# Patient Record
Sex: Female | Born: 1965 | Race: White | Hispanic: No | Marital: Single | Smoking: Never smoker
Health system: Southern US, Community
[De-identification: ages and names within clinical notes are randomized; demographics above are authoritative.]

## PROBLEM LIST (undated history)

## (undated) DIAGNOSIS — IMO0002 Reserved for concepts with insufficient information to code with codable children: Secondary | ICD-10-CM

## (undated) DIAGNOSIS — N393 Stress incontinence (female) (male): Secondary | ICD-10-CM

## (undated) DIAGNOSIS — F988 Other specified behavioral and emotional disorders with onset usually occurring in childhood and adolescence: Secondary | ICD-10-CM

## (undated) DIAGNOSIS — K219 Gastro-esophageal reflux disease without esophagitis: Secondary | ICD-10-CM

## (undated) DIAGNOSIS — J45909 Unspecified asthma, uncomplicated: Secondary | ICD-10-CM

## (undated) DIAGNOSIS — N35919 Unspecified urethral stricture, male, unspecified site: Secondary | ICD-10-CM

## (undated) DIAGNOSIS — R319 Hematuria, unspecified: Secondary | ICD-10-CM

---

## 2000-12-27 ENCOUNTER — Other Ambulatory Visit: Admission: RE | Admit: 2000-12-27 | Discharge: 2000-12-27 | Payer: Self-pay | Admitting: Obstetrics and Gynecology

## 2001-08-01 ENCOUNTER — Inpatient Hospital Stay (HOSPITAL_COMMUNITY): Admission: AD | Admit: 2001-08-01 | Discharge: 2001-08-03 | Payer: Self-pay | Admitting: Obstetrics and Gynecology

## 2001-08-04 ENCOUNTER — Encounter: Admission: RE | Admit: 2001-08-04 | Discharge: 2001-09-03 | Payer: Self-pay | Admitting: Obstetrics and Gynecology

## 2001-08-28 ENCOUNTER — Other Ambulatory Visit: Admission: RE | Admit: 2001-08-28 | Discharge: 2001-08-28 | Payer: Self-pay | Admitting: Obstetrics and Gynecology

## 2002-10-21 ENCOUNTER — Other Ambulatory Visit: Admission: RE | Admit: 2002-10-21 | Discharge: 2002-10-21 | Payer: Self-pay | Admitting: Obstetrics and Gynecology

## 2003-05-05 ENCOUNTER — Inpatient Hospital Stay (HOSPITAL_COMMUNITY): Admission: AD | Admit: 2003-05-05 | Discharge: 2003-05-07 | Payer: Self-pay | Admitting: Obstetrics and Gynecology

## 2003-06-14 ENCOUNTER — Other Ambulatory Visit: Admission: RE | Admit: 2003-06-14 | Discharge: 2003-06-14 | Payer: Self-pay | Admitting: Obstetrics and Gynecology

## 2004-08-21 ENCOUNTER — Other Ambulatory Visit: Admission: RE | Admit: 2004-08-21 | Discharge: 2004-08-21 | Payer: Self-pay | Admitting: Obstetrics and Gynecology

## 2005-02-05 ENCOUNTER — Ambulatory Visit: Payer: Self-pay | Admitting: Family Medicine

## 2010-01-11 ENCOUNTER — Encounter: Admission: RE | Admit: 2010-01-11 | Discharge: 2010-01-11 | Payer: Self-pay | Admitting: Obstetrics and Gynecology

## 2010-01-18 ENCOUNTER — Encounter: Admission: RE | Admit: 2010-01-18 | Discharge: 2010-01-18 | Payer: Self-pay | Admitting: Obstetrics and Gynecology

## 2010-02-03 ENCOUNTER — Encounter: Admission: RE | Admit: 2010-02-03 | Discharge: 2010-02-03 | Payer: Self-pay | Admitting: Obstetrics and Gynecology

## 2013-11-21 HISTORY — PX: INTRAUTERINE DEVICE INSERTION: SHX323

## 2014-03-29 ENCOUNTER — Other Ambulatory Visit: Payer: Self-pay | Admitting: Urology

## 2014-04-08 ENCOUNTER — Encounter (HOSPITAL_BASED_OUTPATIENT_CLINIC_OR_DEPARTMENT_OTHER): Payer: Self-pay | Admitting: *Deleted

## 2014-04-09 ENCOUNTER — Encounter (HOSPITAL_BASED_OUTPATIENT_CLINIC_OR_DEPARTMENT_OTHER): Payer: Self-pay | Admitting: *Deleted

## 2014-04-09 NOTE — Progress Notes (Signed)
NPO AFTER MN. ARRIVE AT 0800. NEEDS HG AND URINE PREG.  WILL TAKE AM MEDS W/ SIPS OF WATER DOS WITH EXCEPTION NO CONCERTA.

## 2014-04-19 ENCOUNTER — Encounter (HOSPITAL_BASED_OUTPATIENT_CLINIC_OR_DEPARTMENT_OTHER): Payer: Self-pay

## 2014-04-19 ENCOUNTER — Encounter (HOSPITAL_BASED_OUTPATIENT_CLINIC_OR_DEPARTMENT_OTHER)
Admission: RE | Disposition: A | Discharge status: Home / Self Care | Payer: Self-pay | Source: Ambulatory Visit | Attending: Urology

## 2014-04-19 ENCOUNTER — Ambulatory Visit (HOSPITAL_BASED_OUTPATIENT_CLINIC_OR_DEPARTMENT_OTHER): Payer: BC Managed Care – PPO | Admitting: Anesthesiology

## 2014-04-19 ENCOUNTER — Ambulatory Visit (HOSPITAL_BASED_OUTPATIENT_CLINIC_OR_DEPARTMENT_OTHER)
Admission: RE | Admit: 2014-04-19 | Discharge: 2014-04-19 | Disposition: A | Discharge status: Home / Self Care | Payer: BC Managed Care – PPO | Source: Ambulatory Visit | Attending: Urology | Admitting: Urology

## 2014-04-19 DIAGNOSIS — J45909 Unspecified asthma, uncomplicated: Secondary | ICD-10-CM | POA: Insufficient documentation

## 2014-04-19 DIAGNOSIS — N186 End stage renal disease: Secondary | ICD-10-CM | POA: Insufficient documentation

## 2014-04-19 DIAGNOSIS — R312 Other microscopic hematuria: Secondary | ICD-10-CM | POA: Diagnosis not present

## 2014-04-19 DIAGNOSIS — R3129 Other microscopic hematuria: Secondary | ICD-10-CM

## 2014-04-19 DIAGNOSIS — N359 Urethral stricture, unspecified: Secondary | ICD-10-CM | POA: Diagnosis not present

## 2014-04-19 HISTORY — DX: Unspecified urethral stricture, male, unspecified site: N35.919

## 2014-04-19 HISTORY — DX: Reserved for concepts with insufficient information to code with codable children: IMO0002

## 2014-04-19 HISTORY — DX: Unspecified asthma, uncomplicated: J45.909

## 2014-04-19 HISTORY — DX: Hematuria, unspecified: R31.9

## 2014-04-19 HISTORY — DX: Stress incontinence (female) (male): N39.3

## 2014-04-19 HISTORY — DX: Gastro-esophageal reflux disease without esophagitis: K21.9

## 2014-04-19 HISTORY — PX: CYSTOSCOPY WITH URETHRAL DILATATION: SHX5125

## 2014-04-19 HISTORY — DX: Other specified behavioral and emotional disorders with onset usually occurring in childhood and adolescence: F98.8

## 2014-04-19 LAB — POCT HEMOGLOBIN-HEMACUE: Hemoglobin: 12.7 g/dL (ref 12.0–15.0)

## 2014-04-19 LAB — POCT PREGNANCY, URINE: Preg Test, Ur: NEGATIVE

## 2014-04-19 SURGERY — CYSTOSCOPY, WITH URETHRAL DILATION
Anesthesia: General | Site: Urethra

## 2014-04-19 MED ORDER — PHENAZOPYRIDINE HCL 200 MG PO TABS: 200.0000 mg | ORAL_TABLET | Freq: Three times a day (TID) | ORAL | Status: DC | PRN

## 2014-04-19 MED ORDER — LACTATED RINGERS IV SOLN
INTRAVENOUS | Status: DC
  Filled 2014-04-19: qty 1000

## 2014-04-19 MED ORDER — ACETAMINOPHEN 10 MG/ML IV SOLN
INTRAVENOUS | Status: DC | PRN
  Administered 2014-04-19: 1000 mg via INTRAVENOUS

## 2014-04-19 MED ORDER — STERILE WATER FOR IRRIGATION IR SOLN
Status: DC | PRN
  Administered 2014-04-19: 3000 mL

## 2014-04-19 MED ORDER — FENTANYL CITRATE 0.05 MG/ML IJ SOLN
25.0000 ug | INTRAMUSCULAR | Status: DC | PRN
  Filled 2014-04-19: qty 1

## 2014-04-19 MED ORDER — PHENAZOPYRIDINE HCL 200 MG PO TABS
200.0000 mg | ORAL_TABLET | Freq: Once | ORAL | Status: AC
  Administered 2014-04-19: 200 mg via ORAL
  Filled 2014-04-19: qty 1

## 2014-04-19 MED ORDER — LACTATED RINGERS IV SOLN
INTRAVENOUS | Status: DC | PRN
  Administered 2014-04-19: 08:00:00 via INTRAVENOUS

## 2014-04-19 MED ORDER — PROMETHAZINE HCL 25 MG/ML IJ SOLN
6.2500 mg | INTRAMUSCULAR | Status: DC | PRN
  Filled 2014-04-19: qty 1

## 2014-04-19 MED ORDER — CIPROFLOXACIN IN D5W 200 MG/100ML IV SOLN
200.0000 mg | INTRAVENOUS | Status: AC
  Administered 2014-04-19: 200 mg via INTRAVENOUS
  Filled 2014-04-19: qty 100

## 2014-04-19 MED ORDER — FENTANYL CITRATE 0.05 MG/ML IJ SOLN
INTRAMUSCULAR | Status: DC | PRN
  Administered 2014-04-19: 25 ug via INTRAVENOUS
  Administered 2014-04-19: 50 ug via INTRAVENOUS
  Administered 2014-04-19: 25 ug via INTRAVENOUS

## 2014-04-19 MED ORDER — DEXAMETHASONE SODIUM PHOSPHATE 10 MG/ML IJ SOLN
INTRAMUSCULAR | Status: DC | PRN
  Administered 2014-04-19: 10 mg via INTRAVENOUS

## 2014-04-19 MED ORDER — LACTATED RINGERS IV SOLN
INTRAVENOUS | Status: DC
  Administered 2014-04-19 (×2): via INTRAVENOUS
  Filled 2014-04-19: qty 1000

## 2014-04-19 MED ORDER — PHENAZOPYRIDINE HCL 100 MG PO TABS
ORAL_TABLET | ORAL | Status: AC
  Filled 2014-04-19: qty 2

## 2014-04-19 MED ORDER — LIDOCAINE HCL (CARDIAC) 20 MG/ML IV SOLN
INTRAVENOUS | Status: DC | PRN
  Administered 2014-04-19: 100 mg via INTRAVENOUS

## 2014-04-19 MED ORDER — PROPOFOL 10 MG/ML IV BOLUS
INTRAVENOUS | Status: DC | PRN
Start: 2014-04-19 — End: 2014-04-19
  Administered 2014-04-19: 200 mg via INTRAVENOUS

## 2014-04-19 MED ORDER — MIDAZOLAM HCL 5 MG/5ML IJ SOLN
INTRAMUSCULAR | Status: DC | PRN
  Administered 2014-04-19 (×2): 1 mg via INTRAVENOUS

## 2014-04-19 MED ORDER — MIDAZOLAM HCL 2 MG/2ML IJ SOLN
INTRAMUSCULAR | Status: AC
  Filled 2014-04-19: qty 2

## 2014-04-19 MED ORDER — CIPROFLOXACIN IN D5W 200 MG/100ML IV SOLN
INTRAVENOUS | Status: AC
  Filled 2014-04-19: qty 100

## 2014-04-19 MED ORDER — MEPERIDINE HCL 25 MG/ML IJ SOLN
6.2500 mg | INTRAMUSCULAR | Status: DC | PRN
  Filled 2014-04-19: qty 1

## 2014-04-19 MED ORDER — FENTANYL CITRATE 0.05 MG/ML IJ SOLN
INTRAMUSCULAR | Status: AC
  Filled 2014-04-19: qty 4

## 2014-04-19 MED ORDER — HYDROCODONE-ACETAMINOPHEN 10-325 MG PO TABS: 1.0000 | ORAL_TABLET | ORAL | Status: DC | PRN

## 2014-04-19 MED ORDER — ONDANSETRON HCL 4 MG/2ML IJ SOLN
INTRAMUSCULAR | Status: DC | PRN
  Administered 2014-04-19: 4 mg via INTRAVENOUS

## 2014-04-19 SURGICAL SUPPLY — 28 items
BAG DRAIN URO-CYSTO SKYTR STRL (DRAIN) ×2 IMPLANT
BAG DRN UROCATH (DRAIN) ×1
BALLN NEPHROSTOMY (BALLOONS)
BALLOON NEPHROSTOMY (BALLOONS) ×1 IMPLANT
CANISTER SUCT LVC 12 LTR MEDI- (MISCELLANEOUS) IMPLANT
CATH FOLEY 2WAY SLVR  5CC 16FR (CATHETERS)
CATH FOLEY 2WAY SLVR 5CC 16FR (CATHETERS) IMPLANT
CLOTH BEACON ORANGE TIMEOUT ST (SAFETY) ×2 IMPLANT
DRAPE CAMERA CLOSED 9X96 (DRAPES) ×2 IMPLANT
ELECT REM PT RETURN 9FT ADLT (ELECTROSURGICAL)
ELECTRODE REM PT RTRN 9FT ADLT (ELECTROSURGICAL) ×1 IMPLANT
GLOVE BIO SURGEON STRL SZ8 (GLOVE) ×2 IMPLANT
GLOVE BIOGEL M 6.5 STRL (GLOVE) ×1 IMPLANT
GLOVE BIOGEL PI IND STRL 6.5 (GLOVE) IMPLANT
GLOVE BIOGEL PI INDICATOR 6.5 (GLOVE) ×2
GOWN STRL REIN XL XLG (GOWN DISPOSABLE) ×1 IMPLANT
GOWN STRL REUS W/TWL LRG LVL3 (GOWN DISPOSABLE) ×1 IMPLANT
GOWN STRL REUS W/TWL XL LVL3 (GOWN DISPOSABLE) ×1 IMPLANT
GOWN XL W/COTTON TOWEL STD (GOWNS) ×1 IMPLANT
IV NS IRRIG 3000ML ARTHROMATIC (IV SOLUTION) IMPLANT
NDL SAFETY ECLIPSE 18X1.5 (NEEDLE) IMPLANT
NEEDLE HYPO 18GX1.5 SHARP (NEEDLE)
NEEDLE HYPO 22GX1.5 SAFETY (NEEDLE) IMPLANT
NS IRRIG 500ML POUR BTL (IV SOLUTION) IMPLANT
PACK CYSTO (CUSTOM PROCEDURE TRAY) ×2 IMPLANT
SYR 20CC LL (SYRINGE) IMPLANT
SYR 30ML LL (SYRINGE) IMPLANT
WATER STERILE IRR 3000ML UROMA (IV SOLUTION) ×3 IMPLANT

## 2014-04-19 NOTE — Anesthesia Preprocedure Evaluation (Signed)
Anesthesia Evaluation  Patient identified by MRN, date of birth, ID band Patient awake    Reviewed: Allergy & Precautions, H&P , NPO status , Patient's Chart, lab work & pertinent test results  Airway Mallampati: II  TM Distance: >3 FB Neck ROM: Full    Dental no notable dental hx.    Pulmonary asthma ,  breath sounds clear to auscultation  Pulmonary exam normal       Cardiovascular negative cardio ROS  Rhythm:Regular Rate:Normal     Neuro/Psych negative neurological ROS  negative psych ROS   GI/Hepatic negative GI ROS, Neg liver ROS,   Endo/Other  negative endocrine ROS  Renal/GU negative Renal ROS  negative genitourinary   Musculoskeletal negative musculoskeletal ROS (+)   Abdominal   Peds negative pediatric ROS (+)  Hematology negative hematology ROS (+)   Anesthesia Other Findings   Reproductive/Obstetrics negative OB ROS                             Anesthesia Physical Anesthesia Plan  ASA: II  Anesthesia Plan: General   Post-op Pain Management:    Induction: Intravenous  Airway Management Planned: LMA  Additional Equipment:   Intra-op Plan:   Post-operative Plan: Extubation in OR  Informed Consent: I have reviewed the patients History and Physical, chart, labs and discussed the procedure including the risks, benefits and alternatives for the proposed anesthesia with the patient or authorized representative who has indicated his/her understanding and acceptance.   Dental advisory given  Plan Discussed with: CRNA  Anesthesia Plan Comments:         Anesthesia Quick Evaluation

## 2014-04-19 NOTE — Anesthesia Procedure Notes (Signed)
Procedure Name: LMA Insertion Date/Time: 04/19/2014 9:40 AM Performed by: Jessica PriestBEESON, Secilia Apps C Pre-anesthesia Checklist: Patient identified, Emergency Drugs available, Suction available and Patient being monitored Patient Re-evaluated:Patient Re-evaluated prior to inductionOxygen Delivery Method: Circle System Utilized Preoxygenation: Pre-oxygenation with 100% oxygen Intubation Type: IV induction Ventilation: Mask ventilation without difficulty LMA: LMA inserted LMA Size: 4.0 Number of attempts: 1 Airway Equipment and Method: bite block Placement Confirmation: positive ETCO2 Tube secured with: Tape Dental Injury: Teeth and Oropharynx as per pre-operative assessment

## 2014-04-19 NOTE — H&P (Signed)
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  History of Present Illness Microscopic hematuria: She reported that she had been told she has always had blood in her urine. Recently she had an episode of what she described as very severe pain in the bladder and this was associated with marked dysuria but her urine culture returned negative. She was treated with empiric antibiotics and her dysuria resolved.     Interval history: No new urologic complaints are noted. She does report that her first child was over 9 pounds and resulted in some infertile trauma. She says she was told in the past that she might have developed some narrowing of the urethra as a cause of recurrent infections that were occurring at that time.   Current Meds 1. Concerta 36 MG Oral Tablet Extended Release;  Therapy: (Recorded:16Nov2015) to Recorded 2. Dexilant 60 MG Oral Capsule Delayed Release;  Therapy: (Recorded:16Nov2015) to Recorded 3. PROzac 20 MG Oral Capsule;  Therapy: (Recorded:16Nov2015) to Recorded 4. Singulair 10 MG Oral Tablet;  Therapy: (Recorded:16Nov2015) to Recorded 5. Vitamin D 2000 UNIT Oral Tablet;  Therapy: (Recorded:16Nov2015) to Recorded 6. ZyrTEC Allergy 10 MG Oral Tablet;  Therapy: (Recorded:16Nov2015) to Recorded  Allergies Medication  1. No Known Drug Allergies Non-Medication  2. Animal dander 3. Dust 4. Mold 5. Ragweed  Family History Problems  1. Family history of diabetes mellitus (Z83.3) : Father 2. Family history of hypertension (Z82.49) : Father  Social History Problems  1. Alcohol use (F10.99) 2. Never a smoker 3. Single  Review of Systems Genitourinary, constitutional, skin, eye, otolaryngeal, hematologic/lymphatic, cardiovascular, pulmonary, endocrine, musculoskeletal, gastrointestinal, neurological and psychiatric system(s) were reviewed and pertinent findings if present are noted.  Genitourinary: urinary frequency, urinary urgency, dysuria,  incontinence, urinary hesitancy, urinary stream starts and stops and hematuria.  Constitutional: feeling tired (fatigue).  Hematologic/Lymphatic: swollen glands.  Respiratory: shortness of breath and cough.    Vitals Vital Signs   Height: 5 ft 8 in Weight: 185 lb  BMI Calculated: 28.13 BSA Calculated: 1.98 Blood Pressure: 124 / 79 Heart Rate: 75  Physical Exam Constitutional: Well nourished and well developed . No acute distress.   ENT:. The ears and nose are normal in appearance.   Neck: The appearance of the neck is normal and no neck mass is present.   Pulmonary: No respiratory distress and normal respiratory rhythm and effort.   Cardiovascular: Heart rate and rhythm are normal . No peripheral edema.   Abdomen: The abdomen is soft and nontender. No masses are palpated. No CVA tenderness. No hernias are palpable. No hepatosplenomegaly noted.   Lymphatics: The femoral and inguinal nodes are not enlarged or tender.   Skin: Normal skin turgor, no visible rash and no visible skin lesions.   Neuro/Psych:. Mood and affect are appropriate.       Results/Data Urine COLOR YELLOW  APPEARANCE CLEAR  SPECIFIC GRAVITY 1.020  pH 7.0  GLUCOSE NEG mg/dL BILIRUBIN NEG  KETONE NEG mg/dL BLOOD MOD  PROTEIN NEG mg/dL UROBILINOGEN 0.2 mg/dL NITRITE NEG  LEUKOCYTE ESTERASE NEG  SQUAMOUS EPITHELIAL/HPF NONE SEEN  WBC NONE SEEN WBC/hpf RBC 3-6 RBC/hpf BACTERIA NONE SEEN  CRYSTALS NONE SEEN  CASTS NONE SEEN   The following images/tracing/specimen were independently visualized:  CT scan as below.   The following radiology reports were reviewed: CT scan. Selected Results  AU CT-HEMATURIA PROTOCOL [id number] 12:00AM Kathie Rhodes  Test Name Result Flag Reference AU CT-HEMATURIA PROTOCOL (Report)   ** RADIOLOGY REPORT BY [facility] **  CLINICAL DATA: 48 year old female with history of microhematuria.  EXAM: CT ABDOMEN AND PELVIS WITHOUT AND WITH  CONTRAST  TECHNIQUE: Multidetector CT imaging of the abdomen and pelvis was performed following the standard protocol before and following the bolus administration of intravenous contrast.  CONTRAST: 125 mL of Isovue 300.  COMPARISON: No priors.  FINDINGS: Lower chest: Mild subsegmental atelectasis or scarring in the lateral aspect of the left lung base.  Hepatobiliary: No cystic for solid hepatic lesions. No intra or extrahepatic biliary ductal dilatation. Gallbladder is normal in appearance.  Pancreas: Unremarkable.  Spleen: Unremarkable.  Adrenals/Urinary Tract: 5 mm nonobstructive calculus in the lower pole collecting system of the right kidney. No additional calculi are identified within the collecting system of the left kidney, along the course of either ureter, or within the lumen of the urinary bladder. No hydroureteronephrosis or perinephric stranding to indicate urinary tract obstruction at this time. Sub cm low-attenuation lesion in the upper pole of the left kidney is too small to definitively characterize, but likely represents a tiny parapelvic cysts. No other suspicious renal lesions are noted. Postcontrast delayed images demonstrate no definite filling defect within the collecting system of either kidney, along the course of either ureter, or within the lumen of the urinary bladder to strongly suggest presence of a urothelial neoplasm. Bilateral adrenal glands are normal in appearance.  Stomach/Bowel: The appearance of the stomach is normal. No pathologic dilatation of small bowel or colon. Normal appendix.  Vascular/Lymphatic: No significant atherosclerotic disease identified within the abdominal or pelvic vasculature. No aneurysm or dissection. Retroaortic left renal vein (normal anatomical variant) incidentally noted. No lymphadenopathy noted in the abdomen or pelvis.  Reproductive: IUD present in the uterus. IUD appears properly located. Uterus and  ovaries are otherwise unremarkable in appearance.  Other: No significant volume of ascites. No pneumoperitoneum.  Musculoskeletal: There are no aggressive appearing lytic or blastic lesions noted in the visualized portions of the skeleton.  IMPRESSION: 1. 5 mm nonobstructive calculus in the lower pole collecting system of the right kidney. No ureteral stones or findings of urinary tract obstruction are noted at this time. 2. No other potential source for microhematuria identified on today's examination. 3. Normal appendix. 4. Additional incidental findings, as above.   Electronically Signed  By: Vinnie Langton M.D.  On: 03/17/2014 13:04  BUN & CREATININE [id number] 04:49PM Kathie Rhodes SPECIMEN TYPE: BLOOD  Test Name Result Flag Reference CREATININE 0.65 mg/dL  0.50-1.40 BUN 11 mg/dL  6-23 Est GFR, African American >89 mL/min   Est GFR, NonAfrican American >89 mL/min   THE ESTIMATED GFR IS A CALCULATION VALID FOR ADULTS (>=38 YEARS OLD) THAT USES THE CKD-EPI ALGORITHM TO ADJUST FOR AGE AND SEX. IT IS   NOT TO BE USED FOR CHILDREN, PREGNANT WOMEN, HOSPITALIZED PATIENTS,    PATIENTS ON DIALYSIS, OR WITH RAPIDLY CHANGING KIDNEY FUNCTION. ACCORDING TO THE NKDEP, EGFR >89 IS NORMAL, 60-89 SHOWS MILD IMPAIRMENT, 30-59 SHOWS MODERATE IMPAIRMENT, 15-29 SHOWS SEVERE IMPAIRMENT AND <15 IS ESRD.  Procedure  Procedure: Cystoscopy performed on 03/22/14 Chaperone Present: .  Indication: Hematuria.  Informed Consent: Risks, benefits, and potential adverse events were discussed and informed consent was obtained from the patient.  Prep: The patient was prepped with hibiclens.  Procedure Note:  Urethral meatus:. No abnormalities.  Anterior urethra: No abnormalities.  Bladder: Visulization was clear. The ureteral orifices were in the normal anatomic position bilaterally and had clear efflux of urine. A systematic survey of the bladder demonstrated no bladder tumors or stones.  The mucosa  was smooth without abnormalities. The patient tolerated the procedure well.  Complications: None.    Assessment   I have discussed with the patient the fact that the CT scan has revealed no abnormality of the upper tract that could contribute to the presence of microscopic hematuria and cystoscopically no abnormality of the lower tract was identified. She did appear to have a stone within the right kidney. It is nonobstructing and we do not know how long it has been present. I told her that it does warrant follow-up with a KUB again in 6 months to assure stability. We also discussed the fact that she has what appears to be a simple cyst in the left kidney.    My attempt at cystoscopy was unsuccessful because she has a significant urethral stenosis/stricture. I was able to dilate her initially from 86 French up to 14 but it was quite uncomfortable and I did not want to push it due to her discomfort. This may be the source of her hematuria but her bladder has not been evaluated and she does have a significant stricture so we discussed proceeding with dilation and cystoscopy under anesthesia. She would like to proceed with that. I gone over the procedure with her today in detail including its risks and complications including the fact that the greatest risk is that of recurrent stricture. We also discussed the probability of success in the outpatient nature of the procedure as well as the anticipated postoperative course. She understands and is elected to proceed.   Plan  She is scheduled for outpatient cystoscopy with urethral dilatation under anesthesia.

## 2014-04-19 NOTE — Discharge Instructions (Signed)

## 2014-04-19 NOTE — Transfer of Care (Signed)
Immediate Anesthesia Transfer of Care Note   Immediate Anesthesia Transfer of Care Note  Patient: Jackie Carr  Procedure(s) Performed: Procedure(s) (LRB): CYSTOSCOPY WITH URETHRAL DILATATION (N/A)  Patient Location: PACU  Anesthesia Type: General  Level of Consciousness: awake, sedated, patient cooperative and responds to stimulation  Airway & Oxygen Therapy: Patient Spontanous Breathing and Patient connected to face mask oxygen  Post-op Assessment: Report given to PACU RN, Post -op Vital signs reviewed and stable and Patient moving all extremities  Post vital signs: Reviewed and stable  Complications: No apparent anesthesia complications

## 2014-04-19 NOTE — Op Note (Signed)
PATIENT:  Jackie Carr  PRE-OPERATIVE DIAGNOSIS:  1. Microscopic hematuria 2. Urethral stenosis/stricture  POST-OPERATIVE DIAGNOSIS: Same  PROCEDURE: 1. Urethral dilatation  2.Diagnostic cystoscopy   SURGEON:  Garnett FarmMark C Cristen Bredeson  INDICATION: Jackie Carr is a 48 year old female who was found to have microscopic hematuria and also had described experiencing a severe pain in the area of the bladder there was associated with dysuria but a negative urine culture. In her acute antibiotics were prescribed by her primary care physician and her dysuria resolved. She has a history of given birth to a 9 pound child which resulted in some introital trauma at the time. She was told in the past that she might of developed some narrowing of the urethra as a cause of your current infections that she had in the past. Workup of her cystoscopy included a CT scan which revealed no abnormality of the upper tract and attempted cystoscopy in my office was uncomfortable. I attempted to dilate her urethra but this was too uncomfortable for her so she is brought to the operating room for examination under anesthesia with urethral dilatation and cystoscopy today.   ANESTHESIA:  General  EBL: None   DRAINS: None  LOCAL MEDICATIONS USED:  None  SPECIMEN:   None   Description of procedure: After informed consent the patient was taken to the operating room and placed on the table in a supine position. General anesthesia was then administered. Once fully anesthetized the patient was moved to the dorsal lithotomy position and the genitalia were sterilely prepped and draped in standard fashion. An official timeout was then performed.   initial evaluation of the vagina revealed a grade 1 cystocele but no lesions. The urethral meatus appeared unremarkable. I was unable to palpate any induration or mass in the area of the urethra.  Urethral dilatation was performed using female sounds starting at 3614 [place] and  progressing to 4322 [place] with little resistance, no bleeding and no difficulty.  The 21 French cystoscope with 12 lens was then passed through the urethra which was noted to be normal in appearance and the bladder was then entered and fully and systematically inspected. Ureteral orifices were of normal configuration and position. There were no tumors, stones or inflammatory lesions seen within the bladder. I therefore drained the bladder and again removed the cystoscope under direct vision which revealed a normal appearing urethra. The patient tolerated the procedure well and there were no intraoperative complications.    PLAN OF CARE: Discharge to home after PACU  PATIENT DISPOSITION:  PACU - hemodynamically stable.

## 2014-04-20 ENCOUNTER — Encounter (HOSPITAL_BASED_OUTPATIENT_CLINIC_OR_DEPARTMENT_OTHER): Payer: Self-pay | Admitting: Urology

## 2014-04-20 NOTE — Anesthesia Postprocedure Evaluation (Signed)
  Anesthesia Post-op Note  Patient: Jackie Carr  Procedure(s) Performed: Procedure(s) (LRB): CYSTOSCOPY WITH URETHRAL DILATATION (N/A)  Patient Location: PACU  Anesthesia Type: General  Level of Consciousness: awake and alert   Airway and Oxygen Therapy: Patient Spontanous Breathing  Post-op Pain: mild  Post-op Assessment: Post-op Vital signs reviewed, Patient's Cardiovascular Status Stable, Respiratory Function Stable, Patent Airway and No signs of Nausea or vomiting  Last Vitals:  Filed Vitals:   04/19/14 1130  BP: 123/79  Pulse: 65  Temp: 36.5 C  Resp: 16    Post-op Vital Signs: stable   Complications: No apparent anesthesia complications

## 2015-01-17 DIAGNOSIS — K219 Gastro-esophageal reflux disease without esophagitis: Secondary | ICD-10-CM | POA: Insufficient documentation

## 2015-01-17 DIAGNOSIS — J309 Allergic rhinitis, unspecified: Secondary | ICD-10-CM | POA: Insufficient documentation

## 2015-01-17 DIAGNOSIS — J45909 Unspecified asthma, uncomplicated: Secondary | ICD-10-CM | POA: Insufficient documentation

## 2015-05-23 ENCOUNTER — Ambulatory Visit (INDEPENDENT_AMBULATORY_CARE_PROVIDER_SITE_OTHER): Payer: BC Managed Care – PPO | Admitting: Allergy and Immunology

## 2015-05-23 ENCOUNTER — Encounter: Payer: Self-pay | Admitting: Allergy and Immunology

## 2015-05-23 VITALS — BP 120/76 | HR 76 | Resp 16 | Ht 67.32 in | Wt 197.8 lb

## 2015-05-23 DIAGNOSIS — H1010 Acute atopic conjunctivitis, unspecified eye: Secondary | ICD-10-CM | POA: Diagnosis not present

## 2015-05-23 DIAGNOSIS — J4520 Mild intermittent asthma, uncomplicated: Secondary | ICD-10-CM

## 2015-05-23 DIAGNOSIS — K219 Gastro-esophageal reflux disease without esophagitis: Secondary | ICD-10-CM

## 2015-05-23 DIAGNOSIS — J387 Other diseases of larynx: Secondary | ICD-10-CM | POA: Diagnosis not present

## 2015-05-23 DIAGNOSIS — J309 Allergic rhinitis, unspecified: Secondary | ICD-10-CM

## 2015-05-23 NOTE — Progress Notes (Signed)
[facility]  Follow-up Note  Referring Provider: No ref. provider found Primary Provider: Dina Rich, MD Date of Office Visit: 05/23/2015  Subjective:   Jackie Carr is a 50 y.o. female who [facility] on 05/23/2015 in re-evaluation of the following:  HPI Comments: Jackie Carr returns to this clinic in reevaluation of her cough. It is been over a year since I've seen her in his clinic.  When I last saw Jackie Carr back in December 2015 we had her use a combination of Dexilant and ranitidine with certainly did give her control of some of her cough but it never completely resolved. Unfortunately, her insurance company took away her Dexilant and then she started to cough like crazy. She subsequently saw Dr. Chales Abrahams who performed an upper endoscopy in January 2017 which identified some swelling of her upper esophageal area which I presume is her posterior position of her larynx. He restarted her on Dexilant and she can tell already that she is better. However, she has never completely gotten rid of her cough. She does continue to have regurgitation about 1 time per day. She does continue to drink coffee in the morning and have a Dr. Reino Kent per day. She does not really have a tremendous amount of shortness of breath or chest tightness but she does note that when she uses a short acting bronchodilator it does help her cough somewhat. She has been intolerant of inhaled steroids in the past because of several precipitating a cough during inhalation. As well, she is try montelukast which has not helped her. She has very little upper airway symptoms at this point in time.    Current Outpatient Prescriptions on File Prior to Visit  Medication Sig Dispense Refill  . albuterol (PROAIR HFA) 108 (90 BASE) MCG/ACT inhaler Inhale 1 puff into the lungs every 6 (six) hours as needed for wheezing or shortness of breath.    .  Cholecalciferol (VITAMIN D3) 1000 UNITS CAPS Take 1 capsule by mouth daily.    Marland Kitchen FLUoxetine (PROZAC) 20 MG capsule Take 20 mg by mouth every morning.    Marland Kitchen HYDROcodone-acetaminophen (NORCO) 10-325 MG per tablet Take 1-2 tablets by mouth every 4 (four) hours as needed for moderate pain. Maximum dose per 24 hours - 8 pills 8 tablet 0  . methylphenidate 36 MG PO CR tablet Take 36 mg by mouth every morning. Reported on 05/23/2015    . ranitidine (ZANTAC) 300 MG tablet Take 300 mg by mouth daily as needed for heartburn.    Marland Kitchen CALCIUM MAGNESIUM 750 PO Take 2 tablets by mouth daily. Reported on 05/23/2015    . Dexlansoprazole (DEXILANT) 30 MG capsule Take 30 mg by mouth every morning. Reported on 05/23/2015    . Flunisolide HFA (AEROSPAN) 80 MCG/ACT AERS Inhale 2 puffs into the lungs every evening. Reported on 05/23/2015    . montelukast (SINGULAIR) 10 MG tablet Take 10 mg by mouth at bedtime. Reported on 05/23/2015    . Omega-3 Fatty Acids (OMEGA-3 FISH OIL PO) Take 2 capsules by mouth daily. Reported on 05/23/2015    . phenazopyridine (PYRIDIUM) 200 MG tablet Take 1 tablet (200 mg total) by mouth 3 (three) times daily as needed for pain. 12 tablet 0   No current facility-administered medications on file prior to visit.    Past Medical History  Diagnosis Date  . Urethral stenosis   . Urethral stricture   . Hematuria   .  Asthma   . SUI (stress urinary incontinence, female)   . ADD (attention deficit disorder)   . GERD (gastroesophageal reflux disease)     Past Surgical History  Procedure Laterality Date  . Intrauterine device insertion  AUG  2015  . Cystoscopy with urethral dilatation N/A 04/19/2014    Procedure: CYSTOSCOPY WITH URETHRAL DILATATION;  Surgeon: Garnett Farm, MD;  Location: [facility];  Service: Urology;  Laterality: N/A;    No Known Allergies  Review of systems negative except as noted in HPI / PMHx or noted below:  Review of Systems  Constitutional: Negative.    HENT: Negative.   Eyes: Negative.   Respiratory: Negative.   Cardiovascular: Negative.   Gastrointestinal: Negative.   Genitourinary: Negative.   Musculoskeletal: Negative.   Skin: Negative.   Neurological: Negative.   Endo/Heme/Allergies: Negative.   Psychiatric/Behavioral: Negative.      Objective:   Filed Vitals:   05/23/15 1706  BP: 120/76  Pulse: 76  Resp: 16   Height: 5' 7.32" (171 cm)  Weight: 197 lb 12 oz (89.7 kg)   Physical Exam  Constitutional: She is well-developed, well-nourished, and in no distress.  HENT:  Head: Normocephalic.  Right Ear: Tympanic membrane, external ear and ear canal normal.  Left Ear: Tympanic membrane, external ear and ear canal normal.  Nose: Nose normal. No mucosal edema or rhinorrhea.  Mouth/Throat: Uvula is midline, oropharynx is clear and moist and mucous membranes are normal. No oropharyngeal exudate.  Eyes: Conjunctivae are normal.  Neck: Trachea normal. No tracheal tenderness present. No tracheal deviation present. No thyromegaly present.  Cardiovascular: Normal rate, regular rhythm, S1 normal, S2 normal and normal heart sounds.   No murmur heard. Pulmonary/Chest: Breath sounds normal. No stridor. No respiratory distress. She has no wheezes. She has no rales.  Musculoskeletal: She exhibits no edema.  Lymphadenopathy:       Head (right side): No tonsillar adenopathy present.       Head (left side): No tonsillar adenopathy present.    She has no cervical adenopathy.    She has no axillary adenopathy.  Neurological: She is alert. Gait normal.  Skin: No rash noted. She is not diaphoretic. No erythema. Nails show no clubbing.  Psychiatric: Mood and affect normal.    Diagnostics:    Spirometry was performed and demonstrated an FEV1 of 1.95 at 60 % of predicted. She had a less than optimal effort on the spirometric maneuver  The patient had an Asthma Control Test with the following results:  .    Assessment and Plan:   1.  LPRD (laryngopharyngeal reflux disease)   2. Asthma, mild intermittent, well-controlled   3. Allergic rhinoconjunctivitis     1. Try using Dexilant 60 mg twice a day for the next 10 days  2. Continue ranitidine 300 mg in the evening  3. Continue to consolidate caffeine use throughout the day  4. Use sample of Arnuity 100 one inhalation 1 time per day for the next 28 days  5. Continue ProAir HFA and antihistamine if needed  6. Return to clinic in 1 year or earlier if problem  7. Contact clinic by telephone concerning response to Dexilant and Arnuity   I think that Jackie's major trigger for her cough is her reflux disease and she would definitely do better she stopped drinking caffeine throughout the day while continuing to use her therapy for reflux. As well, there is the possibility that she will respond to a higher dose  of Dexilant and I gave her samples so that she can use this medication twice a day for the next 10 days. In addition, I've given her a sample of Arnuity to cover the possibility of lower airway inflammation contributing to her cough. She will contact us over the course of the next month noting her response to this treatment and we'll make a decision about how to proceed pending her response.  Laurette Schimke, MD [facility]

## 2015-05-23 NOTE — Patient Instructions (Addendum)
  1. Try using Dexilant 60 mg twice a day for the next 10 days  2. Continue ranitidine 300 mg in the evening  3. Continue to consolidate caffeine use throughout the day  4. Use sample of Arnuity 100 one inhalation 1 time per day for the next 28 days  5. Continue ProAir HFA and antihistamine if needed  6. Return to clinic in 1 year or earlier if problem  7. Contact clinic by telephone concerning response to Dexilant and Arnuity

## 2016-10-29 ENCOUNTER — Other Ambulatory Visit: Payer: Self-pay | Admitting: Allergy and Immunology

## 2016-10-29 MED ORDER — DEXILANT 60 MG PO CPDR: 1.0000 | DELAYED_RELEASE_CAPSULE | Freq: Every day | ORAL | 0 refills | Status: DC

## 2016-10-29 NOTE — Telephone Encounter (Signed)
OK for refill.

## 2016-10-29 NOTE — Telephone Encounter (Signed)
Jackie QualeJacquelyn would like a courtesy refill on DEXILANT.  She was last seen January of 2017.  We made an appointment for 7/23 at 6pm.  [facility] uses the [facility] on [address]

## 2016-11-12 ENCOUNTER — Ambulatory Visit: Payer: BC Managed Care – PPO | Admitting: Allergy and Immunology

## 2016-11-19 ENCOUNTER — Ambulatory Visit: Payer: BC Managed Care – PPO | Admitting: Allergy and Immunology

## 2016-11-19 DIAGNOSIS — J309 Allergic rhinitis, unspecified: Secondary | ICD-10-CM

## 2016-11-26 ENCOUNTER — Encounter: Payer: Self-pay | Admitting: Allergy and Immunology

## 2016-11-26 ENCOUNTER — Ambulatory Visit (INDEPENDENT_AMBULATORY_CARE_PROVIDER_SITE_OTHER): Payer: BC Managed Care – PPO | Admitting: Allergy and Immunology

## 2016-11-26 VITALS — BP 138/82 | HR 76 | Resp 20

## 2016-11-26 DIAGNOSIS — J4520 Mild intermittent asthma, uncomplicated: Secondary | ICD-10-CM

## 2016-11-26 DIAGNOSIS — K219 Gastro-esophageal reflux disease without esophagitis: Secondary | ICD-10-CM

## 2016-11-26 DIAGNOSIS — J3089 Other allergic rhinitis: Secondary | ICD-10-CM

## 2016-11-26 MED ORDER — RANITIDINE HCL 300 MG PO TABS: 300.0000 mg | ORAL_TABLET | Freq: Every day | ORAL | 5 refills | Status: DC

## 2016-11-26 MED ORDER — FLUTICASONE FUROATE 100 MCG/ACT IN AEPB: 1.0000 | INHALATION_SPRAY | Freq: Every day | RESPIRATORY_TRACT | 3 refills | Status: DC

## 2016-11-26 MED ORDER — DEXILANT 60 MG PO CPDR: 1.0000 | DELAYED_RELEASE_CAPSULE | Freq: Every day | ORAL | 5 refills | Status: DC

## 2016-11-26 NOTE — Patient Instructions (Addendum)
  1. Dexilant 60 mg in the morning  2. Restart ranitidine 300 mg in the evening  3. Continue to consolidate caffeine use throughout the day  4. Restart Arnuity 100 one inhalation 1 time per day. Coupon  5. Continue ProAir HFA and antihistamine and Mucinex DM if needed  6. Return to clinic in December 2018 or earlier if problem  7. Obtain fall flu vaccine

## 2016-11-26 NOTE — Progress Notes (Signed)
Follow-up Note  Referring Provider: Olive Bass, MD Primary Provider: Olive Bass, MD Date of Office Visit: 11/26/2016  Subjective:   Jackie Carr Carr (DOB: 1966-04-22) is a 51 y.o. female [facility] in re-evaluation of the following:  HPI: Jackie Carr Carr returns to this clinic in reevaluation of her cough with contribution from reflux-induced respiratory disease and possible asthma. I have not seen her in this clinic since January 2017.  She still continues to cough but it is much better if she can treat her reflux in an aggressive manner. However, she has difficulty performing the therapy needed to control her reflux. Recently she ran out of the Dexilant and she developed very significant coughing and throat clearing and burning up in her chest and she restarted this about 3 weeks ago and her issue is somewhat better. She finds it very hard to use ranitidine at nighttime. She still continues to drink a coffee every morning and occasionally a soft drink.  She did try Arnuity with a sample during last visit and she was able to tolerate the inhalation of this material. All other inhalers of create significant problems with cough when utilized inhalational daily. However, she did not continue to use this medication for a prolonged period in time.   She has no other significant respiratory tract symptoms. She has no chest pain and she has very little issues with shortness of breath or chest tightness or sputum production. She does not really exercise to any degree.   Allergies as of 11/26/2016   No Known Allergies     Medication List      cetirizine 10 MG tablet Commonly known as:  ZYRTEC Take 10 mg by mouth daily.   DEXILANT 60 MG capsule Generic drug:  dexlansoprazole Take 1 capsule (60 mg total) by mouth daily.   FLUoxetine 20 MG capsule Commonly known as:  PROZAC Take 20 mg by mouth every morning.   methylphenidate 36 MG CR  tablet Commonly known as:  CONCERTA Take 36 mg by mouth every morning. Reported on 05/23/2015   Vitamin D3 1000 units Caps Take 1 capsule by mouth daily.       Past Medical History:  Diagnosis Date  . ADD (attention deficit disorder)   . Asthma   . GERD (gastroesophageal reflux disease)   . Hematuria   . SUI (stress urinary incontinence, female)   . Urethral stenosis   . Urethral stricture     Past Surgical History:  Procedure Laterality Date  . CYSTOSCOPY WITH URETHRAL DILATATION N/A 04/19/2014   Procedure: CYSTOSCOPY WITH URETHRAL DILATATION;  Surgeon: Garnett Farm, MD;  Location: [facility];  Service: Urology;  Laterality: N/A;  . INTRAUTERINE DEVICE INSERTION  AUG  2015    Review of systems negative except as noted in HPI / PMHx or noted below:  Review of Systems  Constitutional: Negative.   HENT: Negative.   Eyes: Negative.   Respiratory: Negative.   Cardiovascular: Negative.   Gastrointestinal: Negative.   Genitourinary: Negative.   Musculoskeletal: Negative.   Skin: Negative.   Neurological: Negative.   Endo/Heme/Allergies: Negative.   Psychiatric/Behavioral: Negative.      Objective:   Vitals:   11/26/16 1718  BP: 138/82  Pulse: 76  Resp: 20          Physical Exam  Constitutional: She is well-developed, well-nourished, and in no distress.  HENT:  Head: Normocephalic.  Right Ear: Tympanic  membrane, external ear and ear canal normal.  Left Ear: Tympanic membrane, external ear and ear canal normal.  Nose: Nose normal. No mucosal edema or rhinorrhea.  Mouth/Throat: Uvula is midline, oropharynx is clear and moist and mucous membranes are normal. No oropharyngeal exudate.  Eyes: Conjunctivae are normal.  Neck: Trachea normal. No tracheal tenderness present. No tracheal deviation present. No thyromegaly present.  Cardiovascular: Normal rate, regular rhythm, S1 normal, S2 normal and normal heart sounds.   No murmur  heard. Pulmonary/Chest: Breath sounds normal. No stridor. No respiratory distress. She has no wheezes. She has no rales.  Musculoskeletal: She exhibits no edema.  Lymphadenopathy:       Head (right side): No tonsillar adenopathy present.       Head (left side): No tonsillar adenopathy present.    She has no cervical adenopathy.  Neurological: She is alert. Gait normal.  Skin: No rash noted. She is not diaphoretic. No erythema. Nails show no clubbing.  Psychiatric: Mood and affect normal.    Diagnostics:    Spirometry was performed and demonstrated an FEV1 of 1.78 at 58 % of predicted.  The patient had an Asthma Control Test with the following results: ACT Total Score: 15.    Assessment and Plan:   1. Asthma, mild intermittent, well-controlled   2. Other allergic rhinitis   3. LPRD (laryngopharyngeal reflux disease)     1. Dexilant 60 mg in the morning  2. Restart ranitidine 300 mg in the evening  3. Continue to consolidate caffeine use throughout the day  4. Restart Arnuity 100 one inhalation 1 time per day. Coupon  5. Continue ProAir HFA and antihistamine and Mucinex DM if needed  6. Return to clinic in December 2018 or earlier if problem  7. Obtain fall flu vaccine  I will have encouraged Jackie Carr Carr to organize her life so that she can utilize a combination of medical therapy for her reflux including a PPI in the morning and a H2 receptor blocker in the evening and continue to work towards consolidating her caffeine use. I will also get her to restart an inhaled steroid at this point and we will see how this approach works over the course of the next several months. She will contact me during the interval should there be a significant problem. She is already visited with GI and ENT and has had other extensive workup concerning her cough in the past decade and I'm not really sure she requires any additional evaluation at this point but rather we will just focusing on therapy for  the triggers for her cough at this point.  Laurette SchimkeEric Kaelem Brach, MD Allergy / Immunology [facility]

## 2017-04-08 ENCOUNTER — Telehealth: Payer: Self-pay | Admitting: Allergy and Immunology

## 2017-04-08 ENCOUNTER — Ambulatory Visit: Payer: BC Managed Care – PPO | Admitting: Allergy and Immunology

## 2017-04-08 ENCOUNTER — Encounter: Payer: Self-pay | Admitting: Allergy and Immunology

## 2017-04-08 VITALS — BP 130/82 | HR 76 | Resp 16

## 2017-04-08 DIAGNOSIS — J3089 Other allergic rhinitis: Secondary | ICD-10-CM

## 2017-04-08 DIAGNOSIS — K219 Gastro-esophageal reflux disease without esophagitis: Secondary | ICD-10-CM | POA: Diagnosis not present

## 2017-04-08 DIAGNOSIS — J4520 Mild intermittent asthma, uncomplicated: Secondary | ICD-10-CM | POA: Diagnosis not present

## 2017-04-08 MED ORDER — DEXILANT 60 MG PO CPDR: 1.0000 | DELAYED_RELEASE_CAPSULE | Freq: Every day | ORAL | 5 refills | Status: DC

## 2017-04-08 NOTE — Telephone Encounter (Signed)
Jackie Carr was wondering if Jackie Carr could get her NO SHOW FEE removed because Jackie Carr had an emergency with her mom the day of her appointment.

## 2017-04-08 NOTE — Patient Instructions (Addendum)
  1. Continue Dexilant 60 mg in the morning  2. Restart ranitidine 300 mg in the evening  3. Continue to consolidate caffeine use throughout the day  4. Restart Arnuity 100 one inhalation 1 time per day. Coupon  5. Continue ProAir HFA and antihistamine and Mucinex DM if needed  6. Return to clinic in 6 months or earlier if problem

## 2017-04-08 NOTE — Progress Notes (Signed)
Follow-up Note  Referring Provider: Olive Bassough, Robert L, MD Primary Provider: Olive Bassough, Robert L, MD Date of Office Visit: 04/08/2017  Subjective:   Jackie Carr (DOB: 12/27/1965) is a 51 y.o. female who [facility] 04[facility]/2017 in re-evaluation of the following:  HPI: Jackie Carr returns to this clinic in evaluation of her cough.  I have not seen her in this clinic since 26 November 2016 at which point in time she was doing well yet she still continued to have a persistent cough for which I recommended that she treat her reflux more aggressively and start an inhaled steroid.  She has not treated her reflux aggressively and she has not started an inhaled steroid and thus she still continues to have some cough.  These are coughing spells that are very sporadic.  She does not have a tremendous amount of shortness of breath or chest tightness or any chest pain.  She still does continue to have some occasional issues with reflux.  She still continues to drink caffeine every day.  Allergies as of 04/08/2017   No Known Allergies     Medication List      cetirizine 10 MG tablet Commonly known as:  ZYRTEC Take 10 mg by mouth daily.   DEXILANT 60 MG capsule Generic drug:  dexlansoprazole Take 1 capsule (60 mg total) by mouth daily.   estradiol 0.1 mg/24hr patch Commonly known as:  CLIMARA - Dosed in mg/24 hr APPLY 1 PATCH WEEKLY AS DIRECTED.   FLUoxetine 40 MG capsule Commonly known as:  PROZAC Take by mouth daily.   Fluticasone Furoate 100 MCG/ACT Aepb Commonly known as:  ARNUITY ELLIPTA Inhale 1 puff into the lungs daily.   methylphenidate 36 MG CR tablet Commonly known as:  CONCERTA Take 36 mg by mouth every morning. Reported on 05/23/2015   ranitidine 300 MG tablet Commonly known as:  ZANTAC Take 1 tablet (300 mg total) by mouth at bedtime.   Vitamin D3 1000 units Caps Take 1 capsule by mouth daily.       Past Medical History:  Diagnosis Date   . ADD (attention deficit disorder)   . Asthma   . GERD (gastroesophageal reflux disease)   . Hematuria   . SUI (stress urinary incontinence, female)   . Urethral stenosis   . Urethral stricture     Past Surgical History:  Procedure Laterality Date  . CYSTOSCOPY WITH URETHRAL DILATATION N/A 04/19/2014   Procedure: CYSTOSCOPY WITH URETHRAL DILATATION;  Surgeon: Garnett FarmMark C Ottelin, MD;  [facility];  Service: Urology;  Laterality: N/A;  . INTRAUTERINE DEVICE INSERTION  AUG  2015    Review of systems negative except as noted in HPI / PMHx or noted below:  Review of Systems  Constitutional: Negative.   HENT: Negative.   Eyes: Negative.   Respiratory: Negative.   Cardiovascular: Negative.   Gastrointestinal: Negative.   Genitourinary: Negative.   Musculoskeletal: Negative.   Skin: Negative.   Neurological: Negative.   Endo/Heme/Allergies: Negative.   Psychiatric/Behavioral: Negative.      Objective:   Vitals:   04/08/17 1751  BP: 130/82  Pulse: 76  Resp: 16          Physical Exam  Constitutional: She is well-developed, well-nourished, and in no distress.  HENT:  Head: Normocephalic.  Right Ear: Tympanic membrane, external ear and ear canal normal.  Left Ear: Tympanic membrane, external ear and ear canal normal.  Nose: Nose normal.  No mucosal edema or rhinorrhea.  Mouth/Throat: Uvula is midline, oropharynx is clear and moist and mucous membranes are normal. No oropharyngeal exudate.  Eyes: Conjunctivae are normal.  Neck: Trachea normal. No tracheal tenderness present. No tracheal deviation present. No thyromegaly present.  Cardiovascular: Normal rate, regular rhythm, S1 normal, S2 normal and normal heart sounds.  No murmur heard. Pulmonary/Chest: Breath sounds normal. No stridor. No respiratory distress. She has no wheezes. She has no rales.  Musculoskeletal: She exhibits no edema.  Lymphadenopathy:       Head (right side): No tonsillar  adenopathy present.       Head (left side): No tonsillar adenopathy present.    She has no cervical adenopathy.  Neurological: She is alert. Gait normal.  Skin: No rash noted. She is not diaphoretic. No erythema. Nails show no clubbing.  Psychiatric: Mood and affect normal.    Diagnostics:    Spirometry was performed and demonstrated an FEV1 of 1.87 at 61 % of predicted.  The patient had an Asthma Control Test with the following results: ACT Total Score: 14.    Assessment and Plan:   1. Asthma, mild intermittent, well-controlled   2. Other allergic rhinitis   3. LPRD (laryngopharyngeal reflux disease)     1. Continue Dexilant 60 mg in the morning  2. Restart ranitidine 300 mg in the evening  3. Continue to consolidate caffeine use throughout the day  4. Restart Arnuity 100 one inhalation 1 time per day. Coupon  5. Continue ProAir HFA and antihistamine and Mucinex DM if needed  6. Return to clinic in 6 months or earlier if problem  Jackie Carr needs to treat reflux and use some anti-inflammatory agents for her respiratory tract to see what type of effect she gets from this approach.  Until she does so it will be difficult to assign an alternate plan to address her respiratory tract symptoms.  If all of her symptoms abate with the plan mentioned above then there is no need to have her undergo any further therapy.  I will see her back in this clinic and in 6 months or earlier if there is a problem.  Laurette SchimkeEric Kozlow, MD Allergy / Immunology [facility]

## 2017-04-09 ENCOUNTER — Encounter: Payer: Self-pay | Admitting: Allergy and Immunology

## 2017-04-09 NOTE — Telephone Encounter (Signed)
Removed first no show fee.

## 2017-10-01 ENCOUNTER — Other Ambulatory Visit: Payer: Self-pay | Admitting: Allergy and Immunology

## 2017-11-23 ENCOUNTER — Other Ambulatory Visit: Payer: Self-pay | Admitting: Allergy and Immunology

## 2018-03-18 ENCOUNTER — Other Ambulatory Visit: Payer: Self-pay | Admitting: Allergy and Immunology

## 2018-05-13 ENCOUNTER — Telehealth: Payer: Self-pay | Admitting: Allergy and Immunology

## 2018-05-13 ENCOUNTER — Other Ambulatory Visit: Payer: Self-pay | Admitting: Allergy and Immunology

## 2018-05-13 ENCOUNTER — Ambulatory Visit: Payer: Self-pay | Admitting: Allergy

## 2018-05-13 ENCOUNTER — Other Ambulatory Visit: Payer: Self-pay | Admitting: *Deleted

## 2018-05-13 MED ORDER — DEXILANT 60 MG PO CPDR: DELAYED_RELEASE_CAPSULE | ORAL | 0 refills | Status: DC

## 2018-05-13 NOTE — Telephone Encounter (Signed)
Patient needs a refill on DEXILANT sent into the [facility] on [address] Any questions= please call

## 2018-05-13 NOTE — Telephone Encounter (Signed)
Patient was due for a follow up before refills could be sent. Called patient and informed, she made an apt to see Delorse Lek for today.

## 2018-05-26 ENCOUNTER — Encounter: Payer: Self-pay | Admitting: Allergy and Immunology

## 2018-05-26 ENCOUNTER — Ambulatory Visit: Payer: BC Managed Care – PPO | Admitting: Allergy and Immunology

## 2018-05-26 VITALS — BP 118/70 | HR 84 | Temp 98.5°F | Resp 18 | Ht 67.2 in | Wt 208.4 lb

## 2018-05-26 DIAGNOSIS — J3089 Other allergic rhinitis: Secondary | ICD-10-CM | POA: Diagnosis not present

## 2018-05-26 DIAGNOSIS — J4530 Mild persistent asthma, uncomplicated: Secondary | ICD-10-CM | POA: Diagnosis not present

## 2018-05-26 DIAGNOSIS — K219 Gastro-esophageal reflux disease without esophagitis: Secondary | ICD-10-CM | POA: Diagnosis not present

## 2018-05-26 MED ORDER — DEXILANT 60 MG PO CPDR: DELAYED_RELEASE_CAPSULE | ORAL | 1 refills | Status: DC

## 2018-05-26 NOTE — Progress Notes (Signed)
Follow-up Note  Referring Provider: Olive Bass, MD Primary Provider: Olive Bass, MD Date of Office Visit: 05/26/2018  Subjective:   Jackie Carr (DOB: [birth year]) is a 53 y.o. female who [facility] on 05/26/2018 in re-evaluation of the following:  HPI: Jackie Carr returns to this clinic in evaluation of cough secondary to combination of LPR and asthma.  I have not seen her in this clinic since 08 April 2017.  During her last visit I made recommendations regarding treatment of her reflux and treatment of inflammation.  She did follow the instructions regarding her reflux but not her inflammation.  While dramatically consolidating her caffeine consumption and consistently using a proton pump inhibitor she has had excellent control of her reflux and she is doing much better regarding a lot of her coughing.  Currently she only has 1 coffee per day and has eliminated all of her Dr. Reino Kent drinks.  However, she still has a slight cough.  It is not as bad as it was when she had untreated reflux but she still has a cough.  She does not really exercise to any degree.  She has not required a systemic steroid or antibiotic for any type of respiratory tract issue since being seen in this clinic last.  She could not use the inhaled steroid I gave her last time for some ill-defined reason.  She does not receive the flu vaccine.  Allergies as of 05/26/2018   No Known Allergies     Medication List      cetirizine 10 MG tablet Commonly known as:  ZYRTEC Take 10 mg by mouth daily.   DEXILANT 60 MG capsule Generic drug:  dexlansoprazole Take one capsule once daily   estradiol 0.1 mg/24hr patch Commonly known as:  CLIMARA - Dosed in mg/24 hr APPLY 1 PATCH WEEKLY AS DIRECTED.   FLUoxetine 40 MG capsule Commonly known as:  PROZAC Take by mouth daily.   methylphenidate 36 MG CR tablet Commonly known as:  CONCERTA Take 36 mg by mouth every morning.  Reported on 05/23/2015   Vitamin D3 25 MCG (1000 UT) Caps Take 1 capsule by mouth daily.       Past Medical History:  Diagnosis Date  . ADD (attention deficit disorder)   . Asthma   . GERD (gastroesophageal reflux disease)   . Hematuria   . SUI (stress urinary incontinence, female)   . Urethral stenosis   . Urethral stricture     Past Surgical History:  Procedure Laterality Date  . CYSTOSCOPY WITH URETHRAL DILATATION N/A 04/19/2014   Procedure: CYSTOSCOPY WITH URETHRAL DILATATION;  Surgeon: Garnett Farm, MD;  [facility];  Service: Urology;  Laterality: N/A;  . INTRAUTERINE DEVICE INSERTION  AUG  2015    Review of systems negative except as noted in HPI / PMHx or noted below:  Review of Systems  Constitutional: Negative.   HENT: Negative.   Eyes: Negative.   Respiratory: Negative.   Cardiovascular: Negative.   Gastrointestinal: Negative.   Genitourinary: Negative.   Musculoskeletal: Negative.   Skin: Negative.   Neurological: Negative.   Endo/Heme/Allergies: Negative.   Psychiatric/Behavioral: Negative.      Objective:   Vitals:   05/26/18 1742  BP: 118/70  Pulse: 84  Resp: 18  Temp: 98.5 F (36.9 C)  SpO2: 95%   Height: 5' 7.2" (170.7 cm)  Weight: 208 lb 6.4 oz (94.5 kg)   Physical Exam Constitutional:  Appearance: She is not diaphoretic.  HENT:     Head: Normocephalic.     Right Ear: Tympanic membrane, ear canal and external ear normal.     Left Ear: Tympanic membrane, ear canal and external ear normal.     Nose: Nose normal. No mucosal edema or rhinorrhea.     Mouth/Throat:     Pharynx: Uvula midline. No oropharyngeal exudate.  Eyes:     Conjunctiva/sclera: Conjunctivae normal.  Neck:     Thyroid: No thyromegaly.     Trachea: Trachea normal. No tracheal tenderness or tracheal deviation.  Cardiovascular:     Rate and Rhythm: Normal rate and regular rhythm.     Heart sounds: Normal heart sounds, S1 normal and S2  normal. No murmur.  Pulmonary:     Effort: No respiratory distress.     Breath sounds: Normal breath sounds. No stridor. No wheezing or rales.  Lymphadenopathy:     Head:     Right side of head: No tonsillar adenopathy.     Left side of head: No tonsillar adenopathy.     Cervical: No cervical adenopathy.  Skin:    Findings: No erythema or rash.     Nails: There is no clubbing.   Neurological:     Mental Status: She is alert.     Diagnostics:    Spirometry was performed and demonstrated an FEV1 of 1.51 at 49 % of predicted.  Assessment and Plan:   1. Not well controlled mild persistent asthma   2. Other allergic rhinitis   3. LPRD (laryngopharyngeal reflux disease)     1. Continue Dexilant 60 mg in the morning  2. Continue to consolidate caffeine use throughout the day  3. Sample Qvar 80 RediHaler 2 inhalations 1 time per day  4. Continue ProAir HFA and antihistamine and Mucinex DM if needed  5. Return to clinic in 8 weeks or earlier if problem  I have given Jackie Carr a sample of an inhaled steroid to use for the next 8 weeks and we will see what type of response we get at that point in time.  She has had very difficult time addressing her issue with asthma over the course of the past 10 years and is very inconsistent about using medications for this issue.  We have attempted to have her use combination inhalers and inhaled steroids in the past but she has difficulty following these instructions.  Hopefully she will utilize the Qvar consistently for the next 8 weeks.  She will continue to treat her reflux as noted above.  I will see her back in this clinic in 8 weeks or earlier if there is a problem.  Laurette Schimke, MD Allergy / Immunology [facility]

## 2018-05-26 NOTE — Patient Instructions (Addendum)
  1. Continue Dexilant 60 mg in the morning  2. Continue to consolidate caffeine use throughout the day  3.  Sample Qvar 80 RediHaler 2 inhalations 1 time per day  4. Continue ProAir HFA and antihistamine and Mucinex DM if needed  5. Return to clinic in 8 weeks or earlier if problem

## 2018-05-27 ENCOUNTER — Encounter: Payer: Self-pay | Admitting: Allergy and Immunology

## 2018-06-06 ENCOUNTER — Other Ambulatory Visit: Payer: Self-pay | Admitting: Allergy and Immunology

## 2018-07-21 ENCOUNTER — Ambulatory Visit: Payer: BC Managed Care – PPO | Admitting: Allergy and Immunology

## 2018-11-04 ENCOUNTER — Other Ambulatory Visit: Payer: Self-pay | Admitting: Obstetrics and Gynecology

## 2018-11-04 DIAGNOSIS — R928 Other abnormal and inconclusive findings on diagnostic imaging of breast: Secondary | ICD-10-CM

## 2018-11-05 ENCOUNTER — Ambulatory Visit
Admission: RE | Admit: 2018-11-05 | Discharge: 2018-11-05 | Disposition: A | Discharge status: Home / Self Care | Payer: BC Managed Care – PPO | Source: Ambulatory Visit | Attending: Obstetrics and Gynecology | Admitting: Obstetrics and Gynecology

## 2018-11-05 DIAGNOSIS — R928 Other abnormal and inconclusive findings on diagnostic imaging of breast: Secondary | ICD-10-CM

## 2019-02-28 ENCOUNTER — Other Ambulatory Visit: Payer: Self-pay | Admitting: Allergy and Immunology

## 2019-05-08 ENCOUNTER — Other Ambulatory Visit: Payer: Self-pay | Admitting: Allergy and Immunology

## 2019-05-25 ENCOUNTER — Other Ambulatory Visit: Payer: Self-pay | Admitting: Allergy and Immunology

## 2019-05-27 ENCOUNTER — Ambulatory Visit (INDEPENDENT_AMBULATORY_CARE_PROVIDER_SITE_OTHER): Payer: BC Managed Care – PPO | Admitting: Allergy and Immunology

## 2019-05-27 ENCOUNTER — Other Ambulatory Visit: Payer: Self-pay

## 2019-05-27 ENCOUNTER — Encounter: Payer: Self-pay | Admitting: Allergy and Immunology

## 2019-05-27 VITALS — BP 122/74 | HR 76 | Temp 97.3°F | Resp 18 | Ht 67.3 in | Wt 218.4 lb

## 2019-05-27 DIAGNOSIS — J3089 Other allergic rhinitis: Secondary | ICD-10-CM

## 2019-05-27 DIAGNOSIS — R5383 Other fatigue: Secondary | ICD-10-CM

## 2019-05-27 DIAGNOSIS — K219 Gastro-esophageal reflux disease without esophagitis: Secondary | ICD-10-CM

## 2019-05-27 DIAGNOSIS — J4540 Moderate persistent asthma, uncomplicated: Secondary | ICD-10-CM | POA: Diagnosis not present

## 2019-05-27 MED ORDER — TRELEGY ELLIPTA 200-62.5-25 MCG/INH IN AEPB: 1.0000 | INHALATION_SPRAY | Freq: Every day | RESPIRATORY_TRACT | 5 refills | Status: AC

## 2019-05-27 MED ORDER — ALBUTEROL SULFATE HFA 108 (90 BASE) MCG/ACT IN AERS: INHALATION_SPRAY | RESPIRATORY_TRACT | 1 refills | Status: AC

## 2019-05-27 MED ORDER — DEXILANT 60 MG PO CPDR: DELAYED_RELEASE_CAPSULE | ORAL | 1 refills | Status: DC

## 2019-05-27 NOTE — Progress Notes (Signed)
Jackie Carr - [place] [facility] [place]   Follow-up Note  Referring Provider: Algis Greenhouse, MD Primary Provider: Algis Greenhouse, MD Date of Office Visit: 05/27/2019  Subjective:   Jackie Carr (DOB: [birth year]) is a 54 y.o. female who returns to the [facility] [place] on 05/27/2019 in re-evaluation of the following:  HPI: Jackie Carr returns to this clinic in evaluation of asthma and LPR.  I have not seen her in this clinic since 26 May 2018.  She still continues to cough.  She still continues to have shortness of breath and dyspnea on exertion.  She does not use a short acting bronchodilator.  She is not using any inhaled steroids or controller agent as is her usual approach to her lung issue over the course of the past several years.  It does not sound as though she has been administered a systemic steroid or antibiotic for any type of airway issue.  Her reflux is under very good control at this point in time while using Dexilant.  If she misses Dexilant she develops burning and regurgitation.  She has low energy.  She lays around a lot.  She sometimes feels unrested in the morning.  She has never had a sleep study.  She states that she has had her thyroid checked which is normal.  She refuses the flu vaccine and the Covid vaccine.  Allergies as of 05/27/2019   No Known Allergies     Medication List    cetirizine 10 MG tablet Commonly known as: ZYRTEC Take 10 mg by mouth daily.   Dexilant 60 MG capsule Generic drug: dexlansoprazole TAKE ONE CAPSULE BY MOUTH EVERY MORNING AS DIRECTED.   estradiol 0.1 MG/24HR patch Commonly known as: VIVELLE-DOT   FLUoxetine 40 MG capsule Commonly known as: PROZAC Take by mouth daily.   Vitamin D3 25 MCG (1000 UT) Caps Take 1 capsule by mouth daily.       Past Medical History:  Diagnosis Date  . ADD (attention deficit disorder)   . Asthma   . GERD (gastroesophageal reflux disease)   . Hematuria     . SUI (stress urinary incontinence, female)   . Urethral stenosis   . Urethral stricture     Past Surgical History:  Procedure Laterality Date  . CYSTOSCOPY WITH URETHRAL DILATATION N/A 04/19/2014   Procedure: CYSTOSCOPY WITH URETHRAL DILATATION;  Surgeon: Claybon Jabs, MD;  Location: [facility];  Service: Urology;  Laterality: N/A;  . INTRAUTERINE DEVICE INSERTION  AUG  2015    Review of systems negative except as noted in HPI / PMHx or noted below:  Review of Systems  Constitutional: Negative.   HENT: Negative.   Eyes: Negative.   Respiratory: Negative.   Cardiovascular: Negative.   Gastrointestinal: Negative.   Genitourinary: Negative.   Musculoskeletal: Negative.   Skin: Negative.   Neurological: Negative.   Endo/Heme/Allergies: Negative.   Psychiatric/Behavioral: Negative.      Objective:   Vitals:   05/27/19 1526  BP: 122/74  Pulse: 76  Resp: 18  Temp: (!) 97.3 F (36.3 C)  SpO2: 97%   Height: 5' 7.3" (170.9 cm)  Weight: 218 lb 6.4 oz (99.1 kg)   Physical Exam Constitutional:      Appearance: She is not diaphoretic.  HENT:     Head: Normocephalic.     Right Ear: Tympanic membrane, ear canal and external ear normal.     Left Ear: Tympanic membrane, ear canal  and external ear normal.     Nose: Nose normal. No mucosal edema or rhinorrhea.     Mouth/Throat:     Pharynx: Uvula midline. No oropharyngeal exudate.  Eyes:     Conjunctiva/sclera: Conjunctivae normal.  Neck:     Thyroid: No thyromegaly.     Trachea: Trachea normal. No tracheal tenderness or tracheal deviation.  Cardiovascular:     Rate and Rhythm: Normal rate and regular rhythm.     Heart sounds: Normal heart sounds, S1 normal and S2 normal. No murmur.  Pulmonary:     Effort: No respiratory distress.     Breath sounds: Normal breath sounds. No stridor. No wheezing or rales.  Lymphadenopathy:     Head:     Right side of head: No tonsillar adenopathy.     Left side of  head: No tonsillar adenopathy.     Cervical: No cervical adenopathy.  Skin:    Findings: No erythema or rash.     Nails: There is no clubbing.  Neurological:     Mental Status: She is alert.     Diagnostics:    Spirometry was performed and demonstrated an FEV1 of 1.57 at 52 % of predicted.  Assessment and Plan:   1. Not well controlled moderate persistent asthma   2. Other allergic rhinitis   3. LPRD (laryngopharyngeal reflux disease)   4. Other fatigue     1. Continue Dexilant 60 mg in the morning  2. Continue to consolidate caffeine use throughout the day  3. Start Trelegy 200 -1 inhalation 1 time per day  4. Continue ProAir HFA and antihistamine and Mucinex DM if needed  5. Return to clinic in 4 weeks or earlier if problem  6. Further evaluation for fatigue, sleep dysfunction???  I am going to have Cambalache start a combination inhaler for what appears to be significant asthma.  She will continue to use a proton pump inhibitor which is working pretty well for her reflux disease.  When she returns to this clinic in 4 weeks we will assess her lung function and response to treatment and also further investigate the issue of fatigue which may be a sign of sleep apnea.  We may need to obtain a screening nocturnal oximetry study and if abnormal continue evaluation with a sleep study.  Laurette Schimke, MD Allergy / Immunology [facility]

## 2019-05-27 NOTE — Patient Instructions (Addendum)
  1. Continue Dexilant 60 mg in the morning  2. Continue to consolidate caffeine use throughout the day  3.  Start Trelegy 200 -1 inhalation 1 time per day  4. Continue ProAir HFA and antihistamine and Mucinex DM if needed  5. Return to clinic in 4 weeks or earlier if problem  6. Further evaluation for fatigue, sleep dysfunction???

## 2019-05-28 ENCOUNTER — Encounter: Payer: Self-pay | Admitting: Allergy and Immunology

## 2019-05-29 ENCOUNTER — Telehealth: Payer: Self-pay

## 2019-05-29 NOTE — Telephone Encounter (Signed)
Fax from [facility]:  Delxilant not covered by the insurance  Alternatives include:  Pantoprazole, Omeprazole, Esomeprazole, Lansoprazole, Rabeprazole, Famotidine.  Pt has not tried or failed any of the alternatives, please choose an acceptable alternative, thank you.

## 2019-06-01 ENCOUNTER — Telehealth: Payer: Self-pay | Admitting: Allergy and Immunology

## 2019-06-01 MED ORDER — PANTOPRAZOLE SODIUM 40 MG PO TBEC: 40.0000 mg | DELAYED_RELEASE_TABLET | Freq: Every day | ORAL | 5 refills | Status: AC

## 2019-06-01 NOTE — Telephone Encounter (Signed)
Prescription for Pantoprazole 40mg  sent to patients pharmacy.

## 2019-06-01 NOTE — Telephone Encounter (Signed)
Pantoprazole 40 mg 

## 2019-06-01 NOTE — Telephone Encounter (Signed)
Patient returned call to office and stated that the pantoprazole would cost her $33 a month while Dexilant with the PA only costs her $20. She states that she has tried and failed omeprazole, as well as, esomeprazole. Will attempt PA.

## 2019-06-01 NOTE — Telephone Encounter (Signed)
Jackie Carr called in and states she would rather [place] do a PA for Dexilant because she can get it for $20 as opposed to $33 for Protonix.

## 2019-06-02 NOTE — Telephone Encounter (Signed)
PA submitted via CoverMyMeds.

## 2019-06-16 ENCOUNTER — Telehealth: Payer: Self-pay | Admitting: *Deleted

## 2019-06-16 NOTE — Telephone Encounter (Signed)
Prior authorization initiated for Trelegy. Awaiting a reply.

## 2019-06-17 NOTE — Telephone Encounter (Signed)
PA approved, faxed to [facility] and patient informed.

## 2019-11-30 ENCOUNTER — Other Ambulatory Visit: Payer: Self-pay | Admitting: Allergy and Immunology

## 2020-11-03 IMAGING — MG DIGITAL DIAGNOSTIC UNILATERAL LEFT MAMMOGRAM WITH TOMO AND CAD
4 series · 4 of 12 positions shown · non-contrast
Comparison: Previous exam(s).

CLINICAL DATA: The patient was called back for a possible left
breast mass.

EXAM:
DIGITAL DIAGNOSTIC LEFT MAMMOGRAM WITH TOMO
ULTRASOUND LEFT BREAST

[L CC synth-2D]
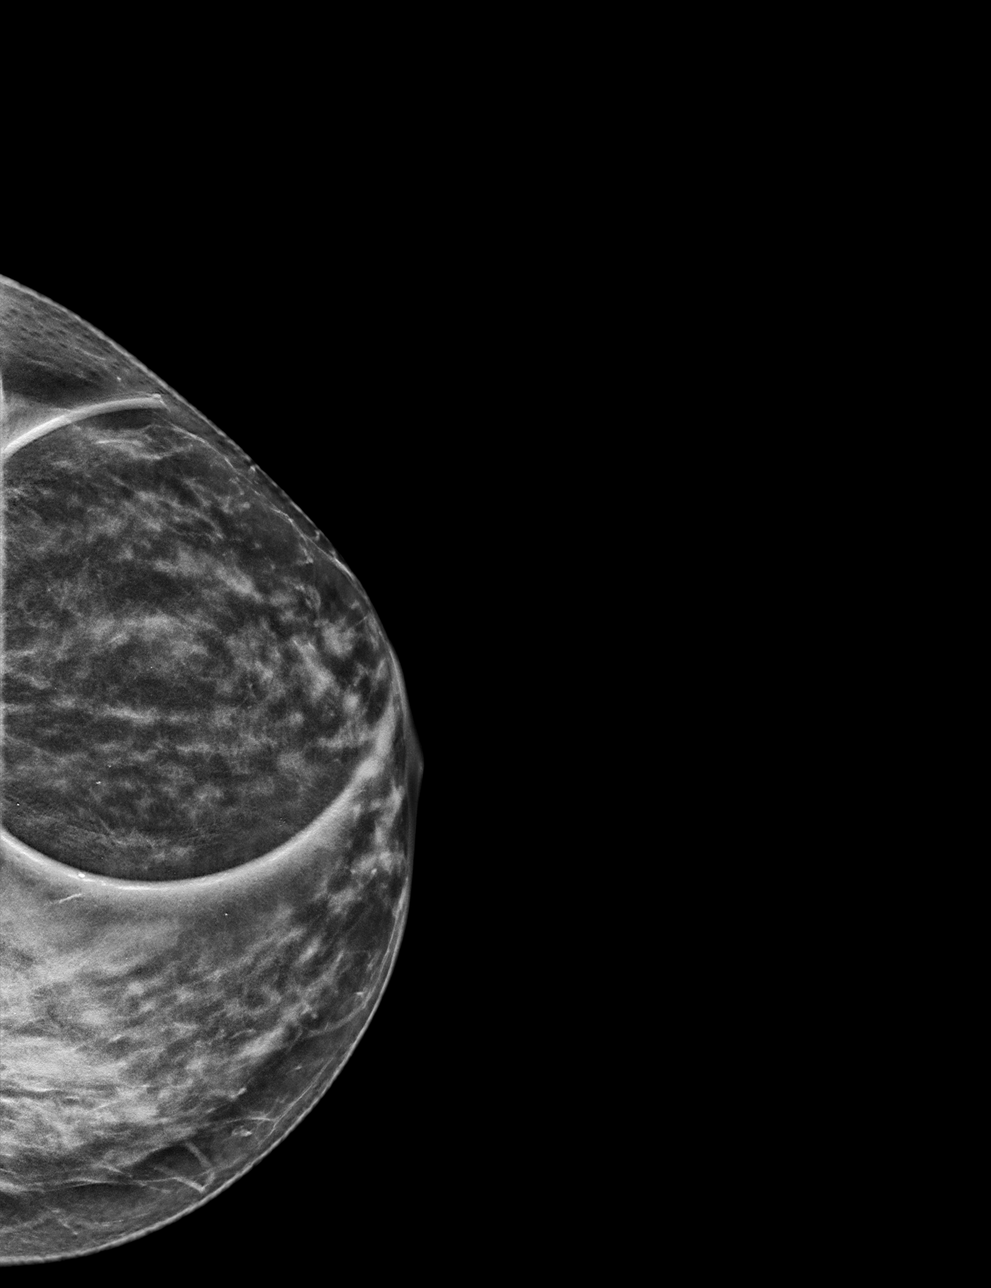

[L MLO synth-2D]
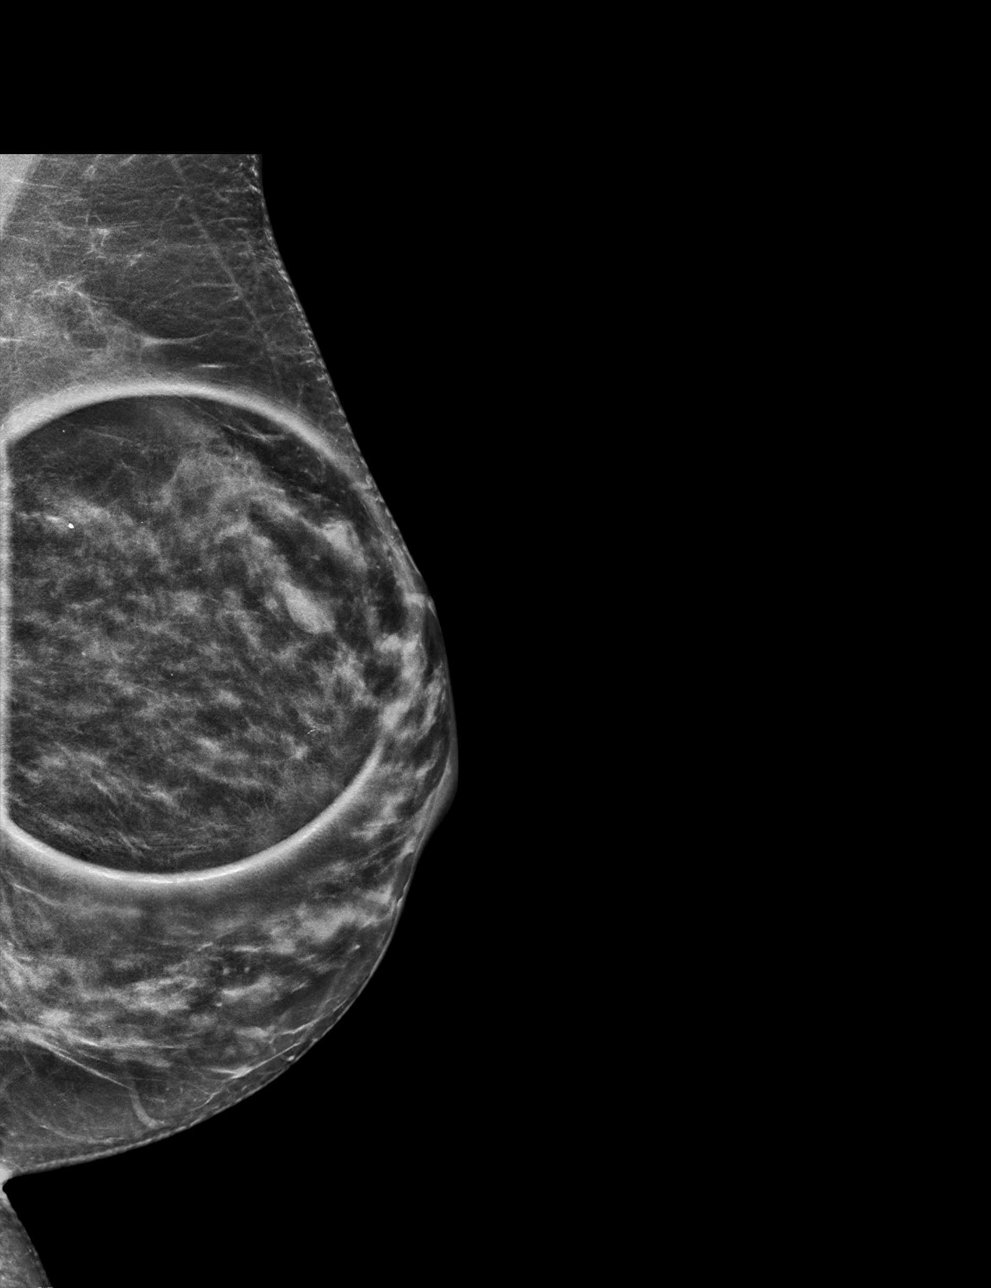

[L MLO tomo · tomo slice 33/65.0]
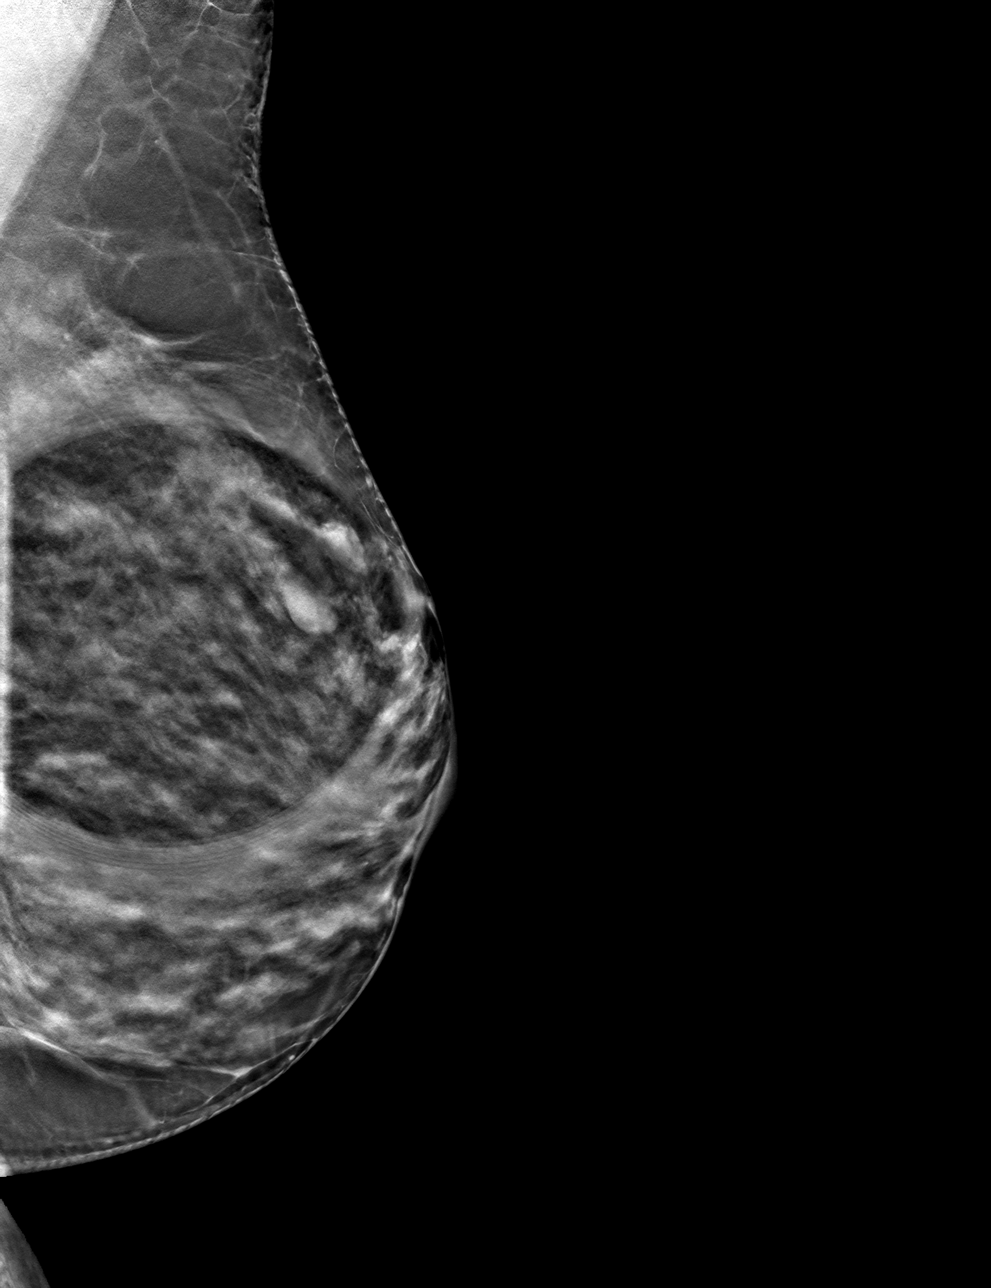

[L CC tomo · tomo slice 30/59.0]
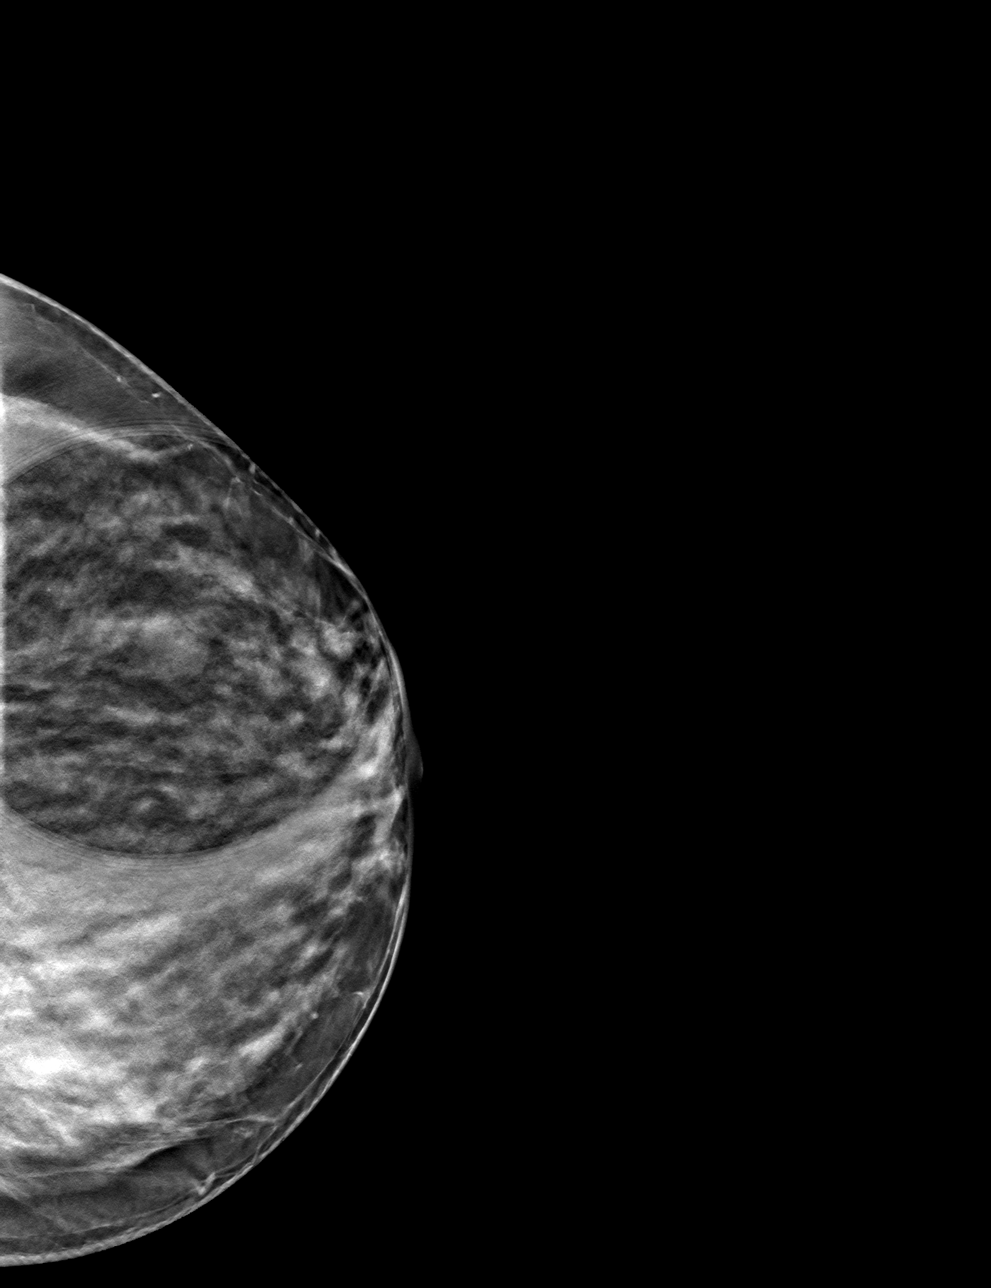

[4 of 12 positions shown; findings below may reference images not displayed]

ACR Breast Density Category c: The breast tissue is heterogeneously
dense, which may obscure small masses.
FINDINGS: The possible mass in the upper outer left breast persists on
additional imaging.

On physical exam, no suspicious lumps are identified.

Targeted ultrasound is performed, showing fibrocystic changes in the
upper outer left breast accounting for mammographic findings.
IMPRESSION: Fibrocystic changes.  No evidence of malignancy.

RECOMMENDATION:
Annual screening mammography.

I have discussed the findings and recommendations with the patient.
Results were also provided in writing at the conclusion of the
visit. If applicable, a reminder letter will be sent to the patient
regarding the next appointment.

BI-RADS CATEGORY  2: Benign.

## 2020-11-03 IMAGING — US ULTRASOUND LEFT BREAST LIMITED
1 series · 9 of 9 positions shown · non-contrast
Comparison: Previous exam(s).

CLINICAL DATA: The patient was called back for a possible left
breast mass.

EXAM:
DIGITAL DIAGNOSTIC LEFT MAMMOGRAM WITH TOMO
ULTRASOUND LEFT BREAST

[Series 1: ultrasound left breast limited · 0.07mm/px · 9 of 9 slices shown]
[im 1/9]
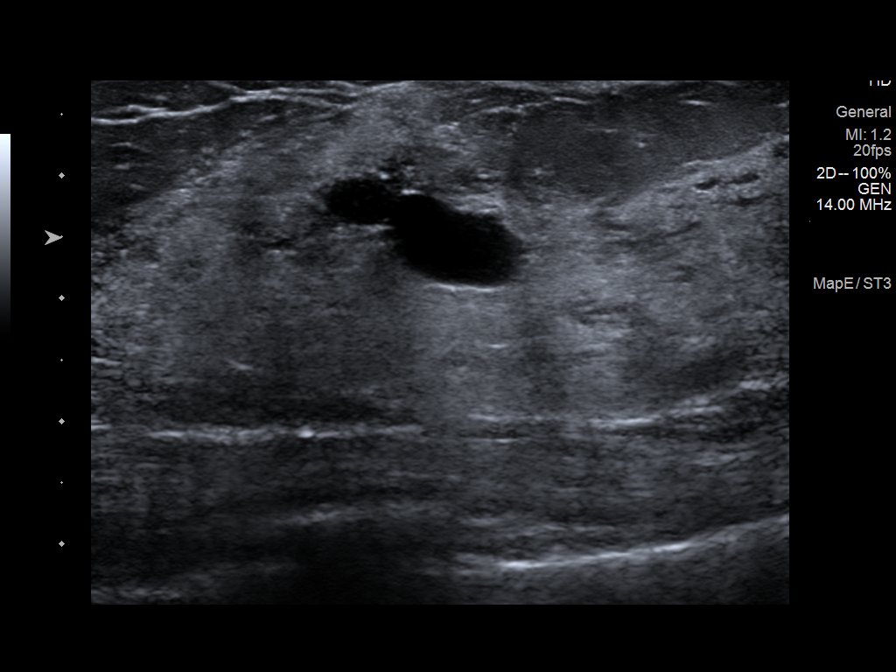
[im 2/9]
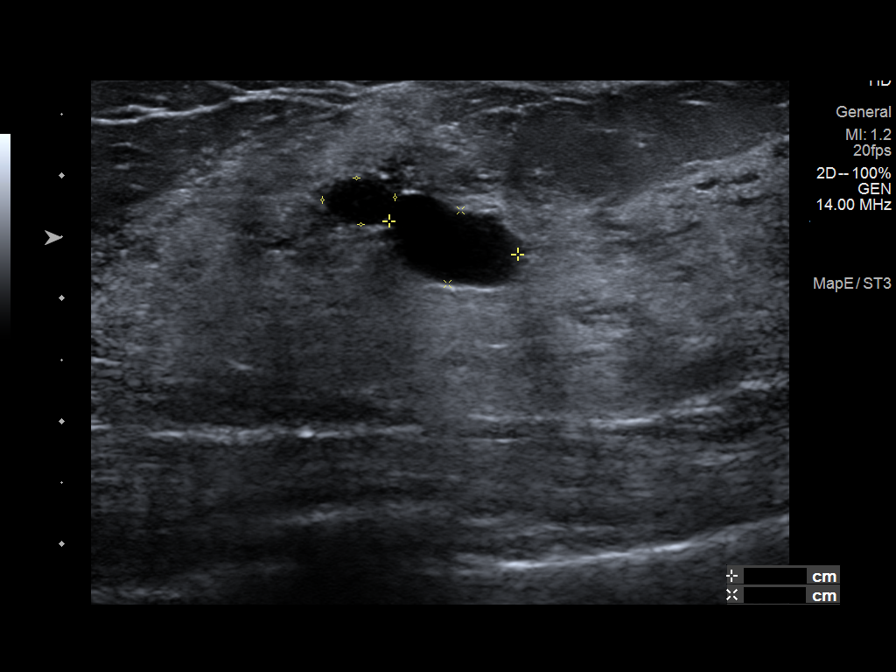
[im 3/9]
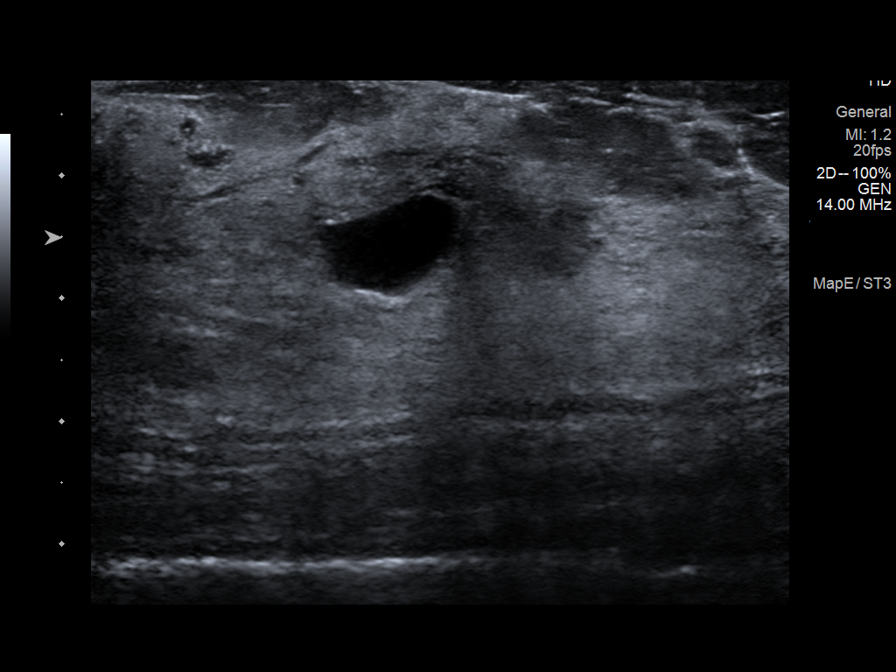
[im 4/9]
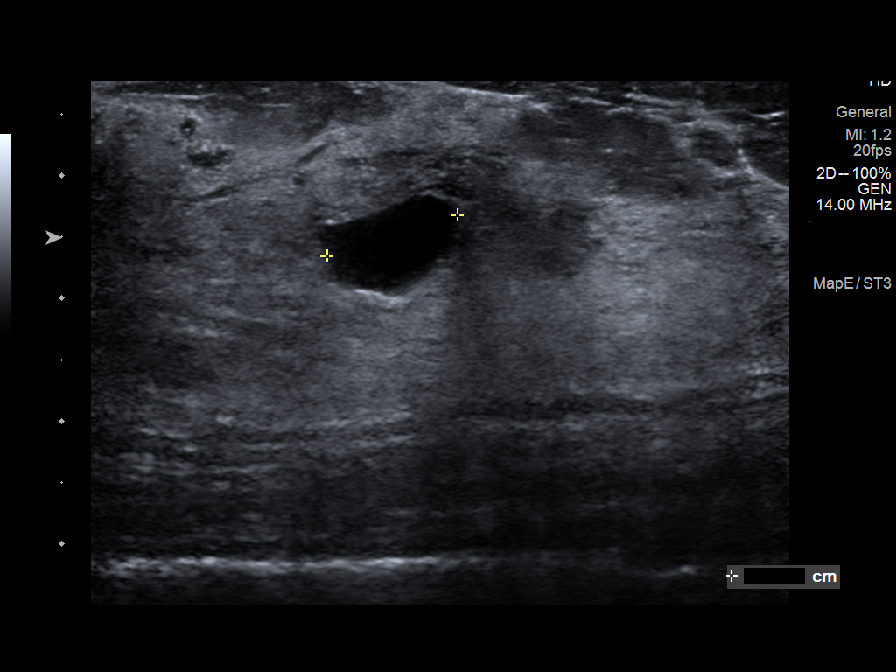
[im 5/9]
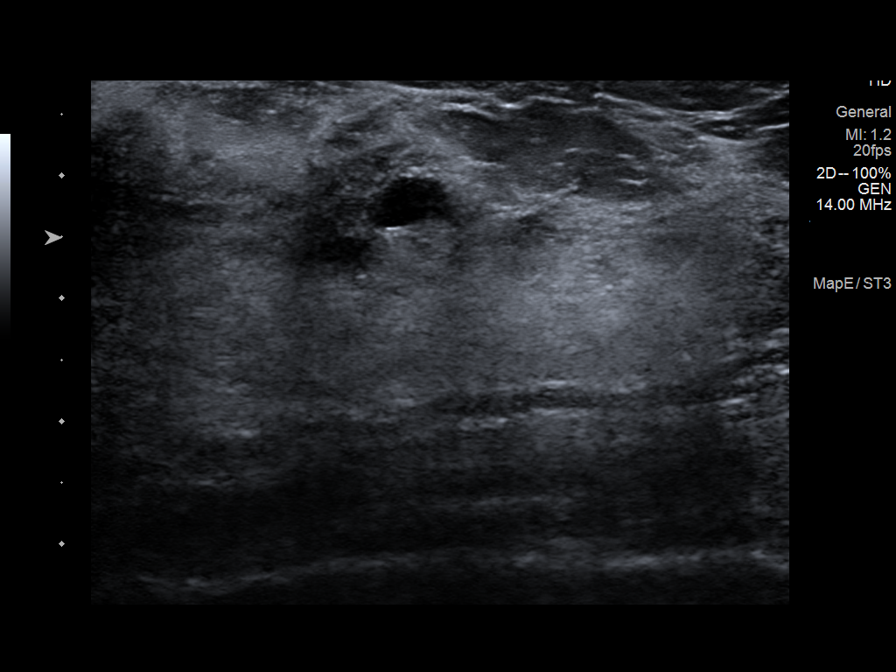
[im 6/9]
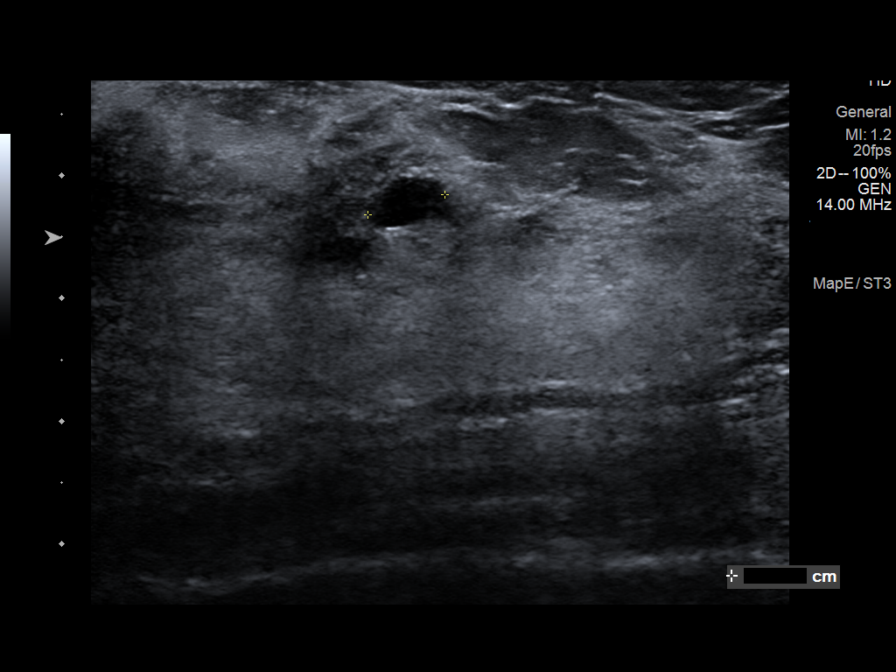
[im 7/9]
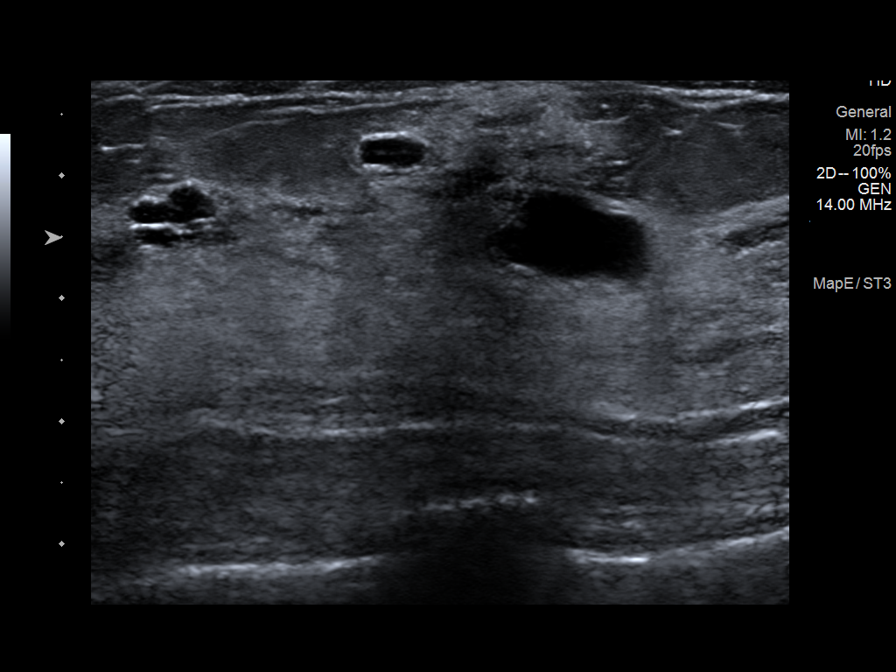
[im 8/9]
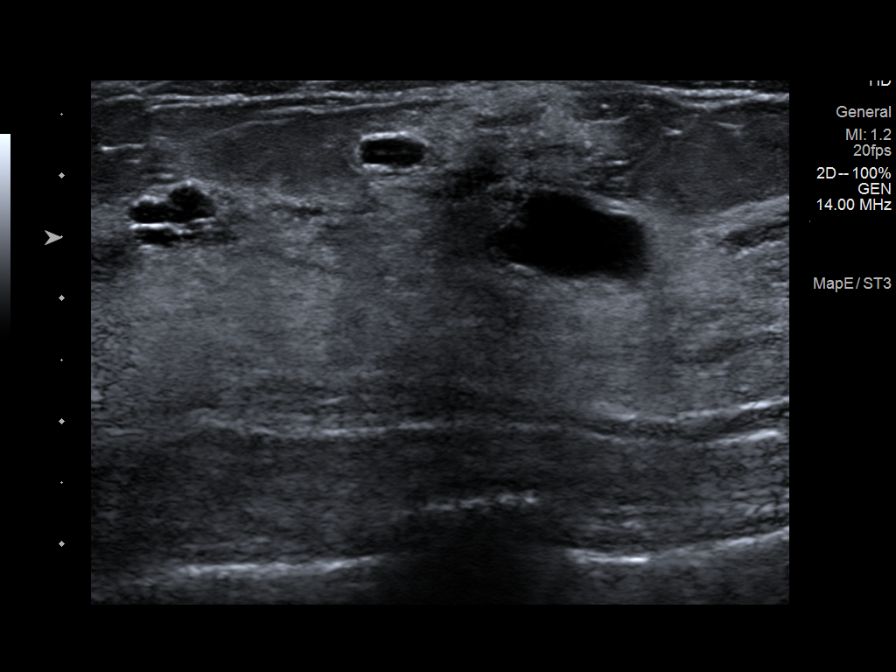
[im 9/9]
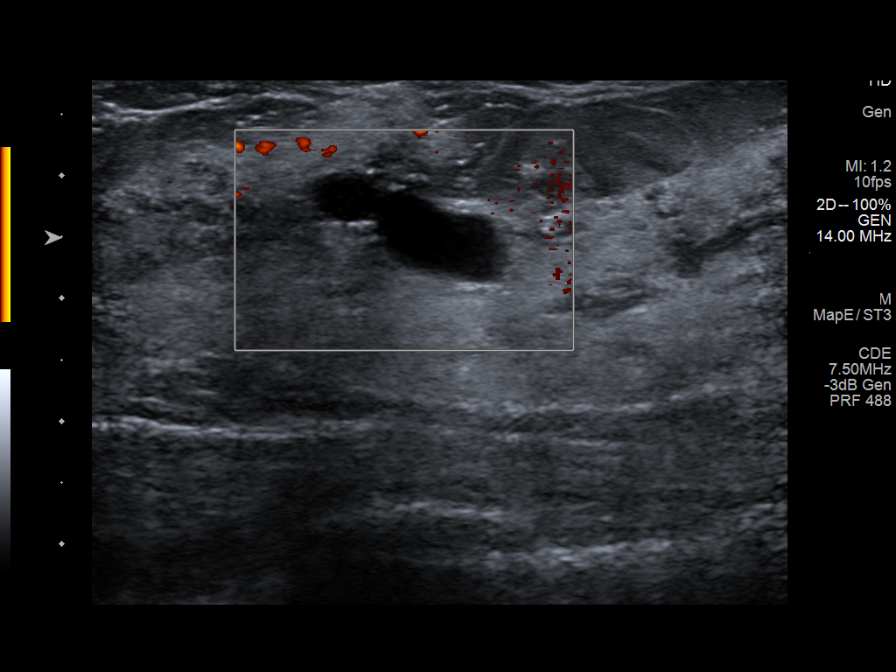

[9 of 9 positions shown; findings below may reference images not displayed]

ACR Breast Density Category c: The breast tissue is heterogeneously
dense, which may obscure small masses.
FINDINGS: The possible mass in the upper outer left breast persists on
additional imaging.

On physical exam, no suspicious lumps are identified.

Targeted ultrasound is performed, showing fibrocystic changes in the
upper outer left breast accounting for mammographic findings.
IMPRESSION: Fibrocystic changes.  No evidence of malignancy.

RECOMMENDATION:
Annual screening mammography.

I have discussed the findings and recommendations with the patient.
Results were also provided in writing at the conclusion of the
visit. If applicable, a reminder letter will be sent to the patient
regarding the next appointment.

BI-RADS CATEGORY  2: Benign.
